# Patient Record
Sex: Male | Born: 1940 | Race: White | Hispanic: No | Marital: Married | Smoking: Never smoker
Health system: Southern US, Community
[De-identification: ages and names within clinical notes are randomized; demographics above are authoritative.]

## PROBLEM LIST (undated history)

## (undated) DIAGNOSIS — G20 Parkinson's disease: Secondary | ICD-10-CM

## (undated) DIAGNOSIS — M1990 Unspecified osteoarthritis, unspecified site: Secondary | ICD-10-CM

## (undated) DIAGNOSIS — K219 Gastro-esophageal reflux disease without esophagitis: Secondary | ICD-10-CM

## (undated) DIAGNOSIS — C801 Malignant (primary) neoplasm, unspecified: Secondary | ICD-10-CM

## (undated) DIAGNOSIS — I499 Cardiac arrhythmia, unspecified: Secondary | ICD-10-CM

## (undated) DIAGNOSIS — R350 Frequency of micturition: Secondary | ICD-10-CM

## (undated) DIAGNOSIS — G3184 Mild cognitive impairment, so stated: Secondary | ICD-10-CM

## (undated) DIAGNOSIS — G20A1 Parkinson's disease without dyskinesia, without mention of fluctuations: Secondary | ICD-10-CM

## (undated) DIAGNOSIS — R2681 Unsteadiness on feet: Secondary | ICD-10-CM

## (undated) HISTORY — PX: HERNIA REPAIR: SHX51

## (undated) HISTORY — PX: TONSILLECTOMY: SUR1361

---

## 2007-04-08 ENCOUNTER — Ambulatory Visit (HOSPITAL_COMMUNITY): Admission: RE | Admit: 2007-04-08 | Discharge: 2007-04-08 | Payer: Self-pay | Admitting: Neurology

## 2007-06-04 ENCOUNTER — Encounter: Payer: Self-pay | Admitting: Internal Medicine

## 2007-06-05 ENCOUNTER — Encounter: Payer: Self-pay | Admitting: Internal Medicine

## 2007-07-06 ENCOUNTER — Encounter: Payer: Self-pay | Admitting: Internal Medicine

## 2009-10-11 ENCOUNTER — Inpatient Hospital Stay: Payer: Self-pay | Admitting: Internal Medicine

## 2009-11-27 ENCOUNTER — Inpatient Hospital Stay: Payer: Self-pay | Admitting: Internal Medicine

## 2009-12-05 ENCOUNTER — Encounter: Payer: Self-pay | Admitting: Internal Medicine

## 2010-01-02 ENCOUNTER — Encounter: Payer: Self-pay | Admitting: Internal Medicine

## 2010-02-02 ENCOUNTER — Encounter: Payer: Self-pay | Admitting: Internal Medicine

## 2010-03-04 ENCOUNTER — Encounter: Payer: Self-pay | Admitting: Internal Medicine

## 2010-08-02 ENCOUNTER — Encounter: Payer: Self-pay | Admitting: Internal Medicine

## 2010-08-04 ENCOUNTER — Encounter: Payer: Self-pay | Admitting: Internal Medicine

## 2010-11-22 ENCOUNTER — Ambulatory Visit: Payer: Self-pay | Admitting: Gastroenterology

## 2012-01-27 ENCOUNTER — Emergency Department: Payer: Self-pay | Admitting: Emergency Medicine

## 2012-01-27 LAB — COMPREHENSIVE METABOLIC PANEL
Anion Gap: 7 (ref 7–16)
BUN: 22 mg/dL — ABNORMAL HIGH (ref 7–18)
Bilirubin,Total: 0.5 mg/dL (ref 0.2–1.0)
Co2: 31 mmol/L (ref 21–32)
EGFR (African American): >60
Osmolality: 286 (ref 275–301)
Potassium: 3.9 mmol/L (ref 3.5–5.1)
SGOT(AST): 23 U/L (ref 15–37)
SGPT (ALT): 24 U/L
Sodium: 142 mmol/L (ref 136–145)
Total Protein: 7.7 g/dL (ref 6.4–8.2)

## 2012-01-27 LAB — URINALYSIS, COMPLETE
Bacteria: NONE SEEN
Leukocyte Esterase: NEGATIVE
Nitrite: NEGATIVE
RBC,UR: 5 /HPF (ref 0–5)
Squamous Epithelial: <1
WBC UR: 1 /HPF (ref 0–5)

## 2012-01-27 LAB — CBC
HCT: 42.5 % (ref 40.0–52.0)
HGB: 14.2 g/dL (ref 13.0–18.0)
RBC: 4.93 10*6/uL (ref 4.40–5.90)
RDW: 13.9 % (ref 11.5–14.5)

## 2012-01-27 LAB — CK TOTAL AND CKMB (NOT AT ARMC): CK-MB: 1.9 ng/mL (ref 0.5–3.6)

## 2012-01-29 LAB — URINE CULTURE

## 2012-02-02 LAB — CULTURE, BLOOD (SINGLE)

## 2013-01-22 ENCOUNTER — Ambulatory Visit: Payer: Self-pay | Admitting: Internal Medicine

## 2013-03-10 ENCOUNTER — Encounter: Payer: Self-pay | Admitting: Neurology

## 2013-04-04 ENCOUNTER — Encounter: Payer: Self-pay | Admitting: Neurology

## 2013-05-04 ENCOUNTER — Encounter: Payer: Self-pay | Admitting: Neurology

## 2013-06-04 ENCOUNTER — Encounter: Payer: Self-pay | Admitting: Neurology

## 2013-06-09 ENCOUNTER — Ambulatory Visit: Payer: Self-pay | Admitting: Neurological Surgery

## 2013-06-11 ENCOUNTER — Other Ambulatory Visit: Payer: Self-pay | Admitting: Neurological Surgery

## 2013-06-29 ENCOUNTER — Encounter (HOSPITAL_COMMUNITY): Payer: Self-pay | Admitting: Pharmacy Technician

## 2013-06-30 ENCOUNTER — Encounter (HOSPITAL_COMMUNITY)
Admission: RE | Admit: 2013-06-30 | Discharge: 2013-06-30 | Disposition: A | Discharge status: Home / Self Care | Payer: Medicare Other | Source: Ambulatory Visit | Attending: Neurological Surgery | Admitting: Neurological Surgery

## 2013-06-30 ENCOUNTER — Encounter (HOSPITAL_COMMUNITY): Payer: Self-pay

## 2013-06-30 DIAGNOSIS — Z01812 Encounter for preprocedural laboratory examination: Secondary | ICD-10-CM | POA: Insufficient documentation

## 2013-06-30 DIAGNOSIS — Z01818 Encounter for other preprocedural examination: Secondary | ICD-10-CM | POA: Insufficient documentation

## 2013-06-30 DIAGNOSIS — Z01810 Encounter for preprocedural cardiovascular examination: Secondary | ICD-10-CM | POA: Insufficient documentation

## 2013-06-30 HISTORY — DX: Parkinson's disease: G20

## 2013-06-30 HISTORY — DX: Cardiac arrhythmia, unspecified: I49.9

## 2013-06-30 HISTORY — DX: Unsteadiness on feet: R26.81

## 2013-06-30 HISTORY — DX: Frequency of micturition: R35.0

## 2013-06-30 HISTORY — DX: Parkinson's disease without dyskinesia, without mention of fluctuations: G20.A1

## 2013-06-30 HISTORY — DX: Unspecified osteoarthritis, unspecified site: M19.90

## 2013-06-30 HISTORY — DX: Gastro-esophageal reflux disease without esophagitis: K21.9

## 2013-06-30 HISTORY — DX: Malignant (primary) neoplasm, unspecified: C80.1

## 2013-06-30 HISTORY — DX: Mild cognitive impairment of uncertain or unknown etiology: G31.84

## 2013-06-30 LAB — BASIC METABOLIC PANEL
CO2: 27 mEq/L (ref 19–32)
Calcium: 9.4 mg/dL (ref 8.4–10.5)
Creatinine, Ser: 1.08 mg/dL (ref 0.50–1.35)

## 2013-06-30 LAB — SURGICAL PCR SCREEN
MRSA, PCR: NEGATIVE
Staphylococcus aureus: NEGATIVE

## 2013-06-30 LAB — CBC WITH DIFFERENTIAL/PLATELET
Basophils Absolute: 0 10*3/uL (ref 0.0–0.1)
Basophils Relative: 1 % (ref 0–1)
Eosinophils Absolute: 0.3 10*3/uL (ref 0.0–0.7)
Hemoglobin: 13.6 g/dL (ref 13.0–17.0)
MCH: 27.8 pg (ref 26.0–34.0)
MCHC: 32.7 g/dL (ref 30.0–36.0)
Monocytes Relative: 7 % (ref 3–12)
Neutrophils Relative %: 68 % (ref 43–77)
Platelets: 229 10*3/uL (ref 150–400)
RDW: 13.5 % (ref 11.5–15.5)

## 2013-06-30 LAB — TYPE AND SCREEN
ABO/RH(D): O NEG
Antibody Screen: NEGATIVE

## 2013-06-30 LAB — ABO/RH: ABO/RH(D): O NEG

## 2013-06-30 LAB — PROTIME-INR: INR: 1.06 (ref 0.00–1.49)

## 2013-06-30 NOTE — Pre-Procedure Instructions (Signed)
JAIMERE FEUTZ  06/30/2013   Your procedure is scheduled on:  Friday, September 5th  Report to [facility] at 1045 AM.  Call this number if you have problems the morning of surgery: [phone]   Remember:   Do not eat food or drink liquids after midnight.   Take these medicines the morning of surgery with A SIP OF WATER: prilosec, ultram if needed   Do not wear jewelry.  Do not wear lotions, powders, or perfume,deodorant.  Do not shave 48 hours prior to surgery. Men may shave face and neck.  Do not bring valuables to the hospital.  [facility] is not responsible   for any belongings or valuables.  Contacts, dentures or bridgework may not be worn into surgery.  Leave suitcase in the car. After surgery it may be brought to your room.  For patients admitted to the hospital, checkout time is 11:00 AM the day of discharge.   Patients discharged the day of surgery will not be allowed to drive home.    Special Instructions: Shower using CHG 2 nights before surgery and the night before surgery.  If you shower the day of surgery use CHG.  Use special wash - you have one bottle of CHG for all showers.  You should use approximately 1/3 of the bottle for each shower.   Please read over the following fact sheets that you were given: Pain Booklet, Coughing and Deep Breathing, Blood Transfusion Information, MRSA Information and Surgical Site Infection Prevention

## 2013-06-30 NOTE — Progress Notes (Signed)
Primary physician - Dr. Nona Dell clinic cardiologist cannot remember name - seen approximately 6 months ago no recent cardiac testing

## 2013-07-08 MED ORDER — DEXAMETHASONE SODIUM PHOSPHATE 10 MG/ML IJ SOLN
10.0000 mg | INTRAMUSCULAR | Status: AC
  Administered 2013-07-09: 10 mg via INTRAVENOUS
  Filled 2013-07-08: qty 1

## 2013-07-08 MED ORDER — CEFAZOLIN SODIUM-DEXTROSE 2-3 GM-% IV SOLR: 2.0000 g | INTRAVENOUS | Status: DC

## 2013-07-09 ENCOUNTER — Inpatient Hospital Stay (HOSPITAL_COMMUNITY)
Admission: RE | Admit: 2013-07-09 | Discharge: 2013-07-15 | DRG: 460 | Disposition: A | Discharge status: Skilled Nursing Facility | Payer: Medicare Other | Source: Ambulatory Visit | Attending: Neurological Surgery | Admitting: Neurological Surgery

## 2013-07-09 ENCOUNTER — Encounter (HOSPITAL_COMMUNITY): Payer: Self-pay | Admitting: Anesthesiology

## 2013-07-09 ENCOUNTER — Encounter (HOSPITAL_COMMUNITY)
Admission: RE | Disposition: A | Discharge status: Skilled Nursing Facility | Payer: Self-pay | Source: Ambulatory Visit | Attending: Neurological Surgery

## 2013-07-09 ENCOUNTER — Inpatient Hospital Stay (HOSPITAL_COMMUNITY): Payer: Medicare Other

## 2013-07-09 ENCOUNTER — Encounter (HOSPITAL_COMMUNITY): Payer: Self-pay | Admitting: *Deleted

## 2013-07-09 ENCOUNTER — Inpatient Hospital Stay (HOSPITAL_COMMUNITY): Payer: Medicare Other | Admitting: Anesthesiology

## 2013-07-09 DIAGNOSIS — G3184 Mild cognitive impairment, so stated: Secondary | ICD-10-CM | POA: Diagnosis present

## 2013-07-09 DIAGNOSIS — G20A1 Parkinson's disease without dyskinesia, without mention of fluctuations: Secondary | ICD-10-CM | POA: Diagnosis present

## 2013-07-09 DIAGNOSIS — M48061 Spinal stenosis, lumbar region without neurogenic claudication: Principal | ICD-10-CM | POA: Diagnosis present

## 2013-07-09 DIAGNOSIS — M129 Arthropathy, unspecified: Secondary | ICD-10-CM | POA: Diagnosis present

## 2013-07-09 DIAGNOSIS — G20 Parkinson's disease: Secondary | ICD-10-CM | POA: Diagnosis present

## 2013-07-09 DIAGNOSIS — R413 Other amnesia: Secondary | ICD-10-CM | POA: Diagnosis present

## 2013-07-09 DIAGNOSIS — K219 Gastro-esophageal reflux disease without esophagitis: Secondary | ICD-10-CM | POA: Diagnosis present

## 2013-07-09 DIAGNOSIS — Z85828 Personal history of other malignant neoplasm of skin: Secondary | ICD-10-CM

## 2013-07-09 DIAGNOSIS — I4891 Unspecified atrial fibrillation: Secondary | ICD-10-CM | POA: Diagnosis present

## 2013-07-09 HISTORY — PX: MAXIMUM ACCESS (MAS)POSTERIOR LUMBAR INTERBODY FUSION (PLIF) 2 LEVEL: SHX6369

## 2013-07-09 SURGERY — FOR MAXIMUM ACCESS (MAS) POSTERIOR LUMBAR INTERBODY FUSION (PLIF) 2 LEVEL
Anesthesia: General | Site: Spine Lumbar | Wound class: Clean

## 2013-07-09 MED ORDER — B COMPLEX-C PO TABS
1.0000 | ORAL_TABLET | Freq: Every day | ORAL | Status: DC
  Administered 2013-07-09 – 2013-07-15 (×7): 1 via ORAL
  Filled 2013-07-09 (×7): qty 1

## 2013-07-09 MED ORDER — HYDROMORPHONE HCL PF 1 MG/ML IJ SOLN
INTRAMUSCULAR | Status: AC
  Filled 2013-07-09: qty 1

## 2013-07-09 MED ORDER — SODIUM CHLORIDE 0.9 % IJ SOLN
3.0000 mL | Freq: Two times a day (BID) | INTRAMUSCULAR | Status: DC
  Administered 2013-07-10 – 2013-07-15 (×10): 3 mL via INTRAVENOUS

## 2013-07-09 MED ORDER — FLUDROCORTISONE ACETATE 0.1 MG PO TABS
0.0500 mg | ORAL_TABLET | Freq: Every day | ORAL | Status: DC
  Administered 2013-07-09 – 2013-07-15 (×7): 0.05 mg via ORAL
  Filled 2013-07-09 (×7): qty 0.5

## 2013-07-09 MED ORDER — DEXAMETHASONE SODIUM PHOSPHATE 4 MG/ML IJ SOLN
4.0000 mg | Freq: Four times a day (QID) | INTRAMUSCULAR | Status: DC
  Administered 2013-07-11 – 2013-07-12 (×2): 4 mg via INTRAVENOUS
  Filled 2013-07-09 (×12): qty 1

## 2013-07-09 MED ORDER — TRAMADOL HCL 50 MG PO TABS: 50.0000 mg | ORAL_TABLET | Freq: Four times a day (QID) | ORAL | Status: DC | PRN

## 2013-07-09 MED ORDER — METHOCARBAMOL 500 MG PO TABS
500.0000 mg | ORAL_TABLET | Freq: Four times a day (QID) | ORAL | Status: DC | PRN
  Administered 2013-07-14 – 2013-07-15 (×2): 500 mg via ORAL
  Filled 2013-07-09 (×3): qty 1

## 2013-07-09 MED ORDER — EPHEDRINE SULFATE 50 MG/ML IJ SOLN
INTRAMUSCULAR | Status: DC | PRN
  Administered 2013-07-09: 10 mg via INTRAVENOUS

## 2013-07-09 MED ORDER — DEXAMETHASONE 4 MG PO TABS
4.0000 mg | ORAL_TABLET | Freq: Four times a day (QID) | ORAL | Status: DC
  Administered 2013-07-09 – 2013-07-11 (×7): 4 mg via ORAL
  Filled 2013-07-09 (×14): qty 1

## 2013-07-09 MED ORDER — POTASSIUM CHLORIDE IN NACL 20-0.9 MEQ/L-% IV SOLN
INTRAVENOUS | Status: DC
  Administered 2013-07-09: 1000 mL via INTRAVENOUS
  Filled 2013-07-09 (×12): qty 1000

## 2013-07-09 MED ORDER — LIDOCAINE HCL (CARDIAC) 20 MG/ML IV SOLN
INTRAVENOUS | Status: DC | PRN
  Administered 2013-07-09: 50 mg via INTRAVENOUS

## 2013-07-09 MED ORDER — ROPINIROLE HCL 0.5 MG PO TABS
0.5000 mg | ORAL_TABLET | Freq: Every day | ORAL | Status: DC
  Administered 2013-07-09 – 2013-07-15 (×7): 0.5 mg via ORAL
  Filled 2013-07-09 (×7): qty 1

## 2013-07-09 MED ORDER — BUPIVACAINE HCL (PF) 0.25 % IJ SOLN
INTRAMUSCULAR | Status: DC | PRN
  Administered 2013-07-09: 10 mL
  Administered 2013-07-09: 8 mL

## 2013-07-09 MED ORDER — PANTOPRAZOLE SODIUM 40 MG PO TBEC
40.0000 mg | DELAYED_RELEASE_TABLET | Freq: Every day | ORAL | Status: DC
  Administered 2013-07-09 – 2013-07-15 (×7): 40 mg via ORAL
  Filled 2013-07-09 (×5): qty 1

## 2013-07-09 MED ORDER — THROMBIN 20000 UNITS EX SOLR
CUTANEOUS | Status: DC | PRN
  Administered 2013-07-09: 15:00:00 via TOPICAL

## 2013-07-09 MED ORDER — METHOCARBAMOL 100 MG/ML IJ SOLN
500.0000 mg | Freq: Four times a day (QID) | INTRAVENOUS | Status: DC | PRN
  Filled 2013-07-09: qty 5

## 2013-07-09 MED ORDER — LACTATED RINGERS IV SOLN
INTRAVENOUS | Status: DC | PRN
  Administered 2013-07-09 (×3): via INTRAVENOUS

## 2013-07-09 MED ORDER — ONDANSETRON HCL 4 MG/2ML IJ SOLN: 4.0000 mg | INTRAMUSCULAR | Status: DC | PRN

## 2013-07-09 MED ORDER — DONEPEZIL HCL 10 MG PO TABS
10.0000 mg | ORAL_TABLET | Freq: Every day | ORAL | Status: DC
  Administered 2013-07-09 – 2013-07-15 (×7): 10 mg via ORAL
  Filled 2013-07-09 (×7): qty 1

## 2013-07-09 MED ORDER — PROPOFOL 10 MG/ML IV BOLUS
INTRAVENOUS | Status: DC | PRN
  Administered 2013-07-09: 200 mg via INTRAVENOUS

## 2013-07-09 MED ORDER — FENTANYL CITRATE 0.05 MG/ML IJ SOLN
INTRAMUSCULAR | Status: DC | PRN
  Administered 2013-07-09: 50 ug via INTRAVENOUS
  Administered 2013-07-09: 75 ug via INTRAVENOUS
  Administered 2013-07-09 (×2): 50 ug via INTRAVENOUS
  Administered 2013-07-09: 75 ug via INTRAVENOUS
  Administered 2013-07-09 (×3): 50 ug via INTRAVENOUS

## 2013-07-09 MED ORDER — CARBIDOPA-LEVODOPA 25-100 MG PO TABS
1.0000 | ORAL_TABLET | Freq: Every day | ORAL | Status: DC
  Administered 2013-07-10 – 2013-07-15 (×6): 1 via ORAL
  Filled 2013-07-09 (×6): qty 1

## 2013-07-09 MED ORDER — PHENOL 1.4 % MT LIQD: 1.0000 | OROMUCOSAL | Status: DC | PRN

## 2013-07-09 MED ORDER — CARBIDOPA-LEVODOPA 25-100 MG PO TABS
1.5000 | ORAL_TABLET | Freq: Two times a day (BID) | ORAL | Status: DC
  Administered 2013-07-09 – 2013-07-15 (×12): 1.5 via ORAL
  Filled 2013-07-09 (×13): qty 1.5

## 2013-07-09 MED ORDER — LACTATED RINGERS IV SOLN
INTRAVENOUS | Status: DC
  Administered 2013-07-09: 11:00:00 via INTRAVENOUS

## 2013-07-09 MED ORDER — MENTHOL 3 MG MT LOZG: 1.0000 | LOZENGE | OROMUCOSAL | Status: DC | PRN

## 2013-07-09 MED ORDER — ASPIRIN EC 81 MG PO TBEC
81.0000 mg | DELAYED_RELEASE_TABLET | Freq: Every morning | ORAL | Status: DC
  Administered 2013-07-10 – 2013-07-15 (×6): 81 mg via ORAL
  Filled 2013-07-09 (×6): qty 1

## 2013-07-09 MED ORDER — ACETAMINOPHEN 650 MG RE SUPP: 650.0000 mg | RECTAL | Status: DC | PRN

## 2013-07-09 MED ORDER — B COMPLEX PO TABS: 1.0000 | ORAL_TABLET | Freq: Every day | ORAL | Status: DC

## 2013-07-09 MED ORDER — SODIUM CHLORIDE 0.9 % IJ SOLN: 3.0000 mL | INTRAMUSCULAR | Status: DC | PRN

## 2013-07-09 MED ORDER — VANCOMYCIN HCL IN DEXTROSE 1-5 GM/200ML-% IV SOLN
1000.0000 mg | Freq: Once | INTRAVENOUS | Status: AC
  Administered 2013-07-09: 1000 mg via INTRAVENOUS

## 2013-07-09 MED ORDER — VANCOMYCIN HCL IN DEXTROSE 1-5 GM/200ML-% IV SOLN
1000.0000 mg | Freq: Once | INTRAVENOUS | Status: AC
  Administered 2013-07-09: 1000 mg via INTRAVENOUS
  Filled 2013-07-09: qty 200

## 2013-07-09 MED ORDER — SODIUM CHLORIDE 0.9 % IV SOLN: 250.0000 mL | INTRAVENOUS | Status: DC

## 2013-07-09 MED ORDER — OXYCODONE HCL 5 MG/5ML PO SOLN: 5.0000 mg | Freq: Once | ORAL | Status: DC | PRN

## 2013-07-09 MED ORDER — SODIUM CHLORIDE 0.9 % IR SOLN
Status: DC | PRN
  Administered 2013-07-09: 15:00:00

## 2013-07-09 MED ORDER — HYDROMORPHONE HCL PF 1 MG/ML IJ SOLN
0.2500 mg | INTRAMUSCULAR | Status: DC | PRN
  Administered 2013-07-09 (×2): 0.25 mg via INTRAVENOUS

## 2013-07-09 MED ORDER — OXYCODONE HCL 5 MG PO TABS: 5.0000 mg | ORAL_TABLET | Freq: Once | ORAL | Status: DC | PRN

## 2013-07-09 MED ORDER — ROCURONIUM BROMIDE 100 MG/10ML IV SOLN
INTRAVENOUS | Status: DC | PRN
  Administered 2013-07-09: 50 mg via INTRAVENOUS

## 2013-07-09 MED ORDER — ONDANSETRON HCL 4 MG/2ML IJ SOLN
INTRAMUSCULAR | Status: DC | PRN
  Administered 2013-07-09: 4 mg via INTRAVENOUS

## 2013-07-09 MED ORDER — METOCLOPRAMIDE HCL 5 MG/ML IJ SOLN: 10.0000 mg | Freq: Once | INTRAMUSCULAR | Status: DC | PRN

## 2013-07-09 MED ORDER — MORPHINE SULFATE 2 MG/ML IJ SOLN
1.0000 mg | INTRAMUSCULAR | Status: DC | PRN
  Administered 2013-07-09: 2 mg via INTRAVENOUS
  Filled 2013-07-09: qty 1

## 2013-07-09 MED ORDER — 0.9 % SODIUM CHLORIDE (POUR BTL) OPTIME
TOPICAL | Status: DC | PRN
  Administered 2013-07-09: 1000 mL

## 2013-07-09 MED ORDER — OXYCODONE-ACETAMINOPHEN 5-325 MG PO TABS
1.0000 | ORAL_TABLET | ORAL | Status: DC | PRN
  Administered 2013-07-10 – 2013-07-11 (×3): 2 via ORAL
  Administered 2013-07-11 – 2013-07-13 (×5): 1 via ORAL
  Administered 2013-07-13: 2 via ORAL
  Administered 2013-07-13 – 2013-07-15 (×9): 1 via ORAL
  Filled 2013-07-09 (×8): qty 1
  Filled 2013-07-09: qty 2
  Filled 2013-07-09 (×2): qty 1
  Filled 2013-07-09: qty 2
  Filled 2013-07-09 (×4): qty 1
  Filled 2013-07-09 (×2): qty 2

## 2013-07-09 MED ORDER — CEFAZOLIN SODIUM 1-5 GM-% IV SOLN: 1.0000 g | Freq: Three times a day (TID) | INTRAVENOUS | Status: DC

## 2013-07-09 MED ORDER — ACETAMINOPHEN 325 MG PO TABS: 650.0000 mg | ORAL_TABLET | ORAL | Status: DC | PRN

## 2013-07-09 SURGICAL SUPPLY — 74 items
APL SKNCLS STERI-STRIP NONHPOA (GAUZE/BANDAGES/DRESSINGS) ×1
BAG DECANTER FOR FLEXI CONT (MISCELLANEOUS) ×2 IMPLANT
BENZOIN TINCTURE PRP APPL 2/3 (GAUZE/BANDAGES/DRESSINGS) ×2 IMPLANT
BLADE SURG ROTATE 9660 (MISCELLANEOUS) IMPLANT
BONE MATRIX OSTEOCEL PRO MED (Bone Implant) ×2 IMPLANT
BUR MATCHSTICK NEURO 3.0 LAGG (BURR) ×2 IMPLANT
CAGE COROENT 10X9X23 (Cage) ×1 IMPLANT
CAGE COROENT 10X9X28-8 LUMBAR (Cage) ×2 IMPLANT
CAGE COROENT 12X28X4 (Cage) ×1 IMPLANT
CANISTER SUCTION 2500CC (MISCELLANEOUS) ×2 IMPLANT
CLIP NEUROVISION LG (CLIP) ×1 IMPLANT
CLOTH BEACON ORANGE TIMEOUT ST (SAFETY) ×2 IMPLANT
CONT SPEC 4OZ CLIKSEAL STRL BL (MISCELLANEOUS) ×4 IMPLANT
COVER BACK TABLE 24X17X13 BIG (DRAPES) IMPLANT
COVER TABLE BACK 60X90 (DRAPES) ×2 IMPLANT
DRAPE C-ARM 42X72 X-RAY (DRAPES) ×2 IMPLANT
DRAPE C-ARMOR (DRAPES) ×2 IMPLANT
DRAPE LAPAROTOMY 100X72X124 (DRAPES) ×2 IMPLANT
DRAPE POUCH INSTRU U-SHP 10X18 (DRAPES) ×2 IMPLANT
DRAPE SURG 17X23 STRL (DRAPES) ×2 IMPLANT
DRESSING TELFA 8X3 (GAUZE/BANDAGES/DRESSINGS) ×1 IMPLANT
DRSG OPSITE 4X5.5 SM (GAUZE/BANDAGES/DRESSINGS) ×2 IMPLANT
DRSG OPSITE POSTOP 4X6 (GAUZE/BANDAGES/DRESSINGS) ×1 IMPLANT
DURAPREP 26ML APPLICATOR (WOUND CARE) ×2 IMPLANT
ELECT REM PT RETURN 9FT ADLT (ELECTROSURGICAL) ×2
ELECTRODE REM PT RTRN 9FT ADLT (ELECTROSURGICAL) ×1 IMPLANT
EVACUATOR 1/8 PVC DRAIN (DRAIN) ×2 IMPLANT
GAUZE SPONGE 4X4 16PLY XRAY LF (GAUZE/BANDAGES/DRESSINGS) IMPLANT
GLOVE BIO SURGEON STRL SZ 6.5 (GLOVE) ×2 IMPLANT
GLOVE BIO SURGEON STRL SZ8 (GLOVE) ×6 IMPLANT
GLOVE BIO SURGEON STRL SZ8.5 (GLOVE) ×1 IMPLANT
GLOVE BIOGEL PI IND STRL 6.5 (GLOVE) IMPLANT
GLOVE BIOGEL PI IND STRL 7.0 (GLOVE) IMPLANT
GLOVE BIOGEL PI INDICATOR 6.5 (GLOVE) ×1
GLOVE BIOGEL PI INDICATOR 7.0 (GLOVE) ×3
GLOVE OPTIFIT SS 6.5 STRL BRWN (GLOVE) ×3 IMPLANT
GLOVE SS BIOGEL STRL SZ 8 (GLOVE) IMPLANT
GLOVE SUPERSENSE BIOGEL SZ 8 (GLOVE) ×1
GOWN BRE IMP SLV AUR LG STRL (GOWN DISPOSABLE) ×2 IMPLANT
GOWN BRE IMP SLV AUR XL STRL (GOWN DISPOSABLE) ×4 IMPLANT
GOWN STRL REIN 2XL LVL4 (GOWN DISPOSABLE) IMPLANT
HEMOSTAT POWDER KIT SURGIFOAM (HEMOSTASIS) IMPLANT
KIT BASIN OR (CUSTOM PROCEDURE TRAY) ×2 IMPLANT
KIT NDL NVM5 EMG ELECT (KITS) IMPLANT
KIT NEEDLE NVM5 EMG ELECT (KITS) ×1 IMPLANT
KIT NEEDLE NVM5 EMG ELECTRODE (KITS) ×1
KIT ROOM TURNOVER OR (KITS) ×2 IMPLANT
MILL MEDIUM DISP (BLADE) ×1 IMPLANT
NDL HYPO 25X1 1.5 SAFETY (NEEDLE) ×1 IMPLANT
NEEDLE HYPO 25X1 1.5 SAFETY (NEEDLE) ×2 IMPLANT
NS IRRIG 1000ML POUR BTL (IV SOLUTION) ×2 IMPLANT
PACK LAMINECTOMY NEURO (CUSTOM PROCEDURE TRAY) ×2 IMPLANT
PAD ARMBOARD 7.5X6 YLW CONV (MISCELLANEOUS) ×8 IMPLANT
ROD 70MM (Rod) ×4 IMPLANT
ROD SPNL 70XPREBNT NS MAS (Rod) IMPLANT
SCREW LOCK (Screw) ×12 IMPLANT
SCREW LOCK FXNS SPNE MAS PL (Screw) IMPLANT
SCREW PLIF MAS 5.0X35 (Screw) ×1 IMPLANT
SCREW SHANK 5.0X30MM (Screw) ×2 IMPLANT
SCREW SHANK 5.0X35 (Screw) ×2 IMPLANT
SCREW TULIP 5.5 (Screw) ×4 IMPLANT
SPONGE LAP 4X18 X RAY DECT (DISPOSABLE) IMPLANT
SPONGE SURGIFOAM ABS GEL 100 (HEMOSTASIS) ×2 IMPLANT
STRIP CLOSURE SKIN 1/2X4 (GAUZE/BANDAGES/DRESSINGS) ×3 IMPLANT
SUT VIC AB 0 CT1 18XCR BRD8 (SUTURE) ×1 IMPLANT
SUT VIC AB 0 CT1 8-18 (SUTURE) ×2
SUT VIC AB 2-0 CP2 18 (SUTURE) ×2 IMPLANT
SUT VIC AB 3-0 SH 8-18 (SUTURE) ×4 IMPLANT
SYR 20ML ECCENTRIC (SYRINGE) ×2 IMPLANT
TOWEL OR 17X24 6PK STRL BLUE (TOWEL DISPOSABLE) ×2 IMPLANT
TOWEL OR 17X26 10 PK STRL BLUE (TOWEL DISPOSABLE) ×2 IMPLANT
TRAY FOLEY CATH 14FR (SET/KITS/TRAYS/PACK) IMPLANT
TRAY FOLEY CATH 14FRSI W/METER (CATHETERS) ×2 IMPLANT
WATER STERILE IRR 1000ML POUR (IV SOLUTION) ×2 IMPLANT

## 2013-07-09 NOTE — Anesthesia Preprocedure Evaluation (Addendum)
Anesthesia Evaluation  Patient identified by MRN, date of birth, ID band Patient awake    Reviewed: Allergy & Precautions, H&P , NPO status , Patient's Chart, lab work & pertinent test results, reviewed documented beta blocker date and time   Airway Mallampati: II TM Distance: >3 FB Neck ROM: full    Dental  (+) Teeth Intact and Dental Advisory Given   Pulmonary neg pulmonary ROS,  breath sounds clear to auscultation        Cardiovascular negative cardio ROS  + dysrhythmias Rhythm:regular     Neuro/Psych Cognitive impairment  negative psych ROS   GI/Hepatic negative GI ROS, Neg liver ROS, GERD-  Medicated and Controlled,  Endo/Other  negative endocrine ROS  Renal/GU negative Renal ROS  negative genitourinary   Musculoskeletal   Abdominal   Peds  Hematology negative hematology ROS (+)   Anesthesia Other Findings See surgeon's H&P   Reproductive/Obstetrics negative OB ROS                         Anesthesia Physical Anesthesia Plan  ASA: III  Anesthesia Plan: General   Post-op Pain Management:    Induction: Intravenous  Airway Management Planned: Oral ETT  Additional Equipment:   Intra-op Plan:   Post-operative Plan: Extubation in OR  Informed Consent: I have reviewed the patients History and Physical, chart, labs and discussed the procedure including the risks, benefits and alternatives for the proposed anesthesia with the patient or authorized representative who has indicated his/her understanding and acceptance.   Dental Advisory Given  Plan Discussed with: CRNA and Surgeon  Anesthesia Plan Comments:        Anesthesia Quick Evaluation

## 2013-07-09 NOTE — Preoperative (Addendum)
Beta Blockers   Reason not to administer Beta Blockers:Not on Beta Blocker 

## 2013-07-09 NOTE — Anesthesia Postprocedure Evaluation (Signed)
  Anesthesia Post-op Note  Patient: Ian Estrada  Procedure(s) Performed: Procedure(s) with comments: FOR MAXIMUM ACCESS (MAS) POSTERIOR LUMBAR INTERBODY FUSION (PLIF) 2 LEVEL LUMBAR THREE-FOUR,LUMBAR FOUR-FIVE (N/A) - FOR MAXIMUM ACCESS (MAS) POSTERIOR LUMBAR INTERBODY FUSION (PLIF) 2 LEVEL  Patient Location: PACU  Anesthesia Type:General  Level of Consciousness: awake and patient cooperative  Airway and Oxygen Therapy: Patient Spontanous Breathing  Post-op Pain: mild  Post-op Assessment: Post-op Vital signs reviewed  Post-op Vital Signs: stable  Complications: No apparent anesthesia complications

## 2013-07-09 NOTE — Op Note (Signed)
07/09/2013  6:46 PM  PATIENT:  Ian Estrada  72 y.o. male  PRE-OPERATIVE DIAGNOSIS:  Spondylolisthesis L4-5, severe spinal stenosis L3-4 L4-5, back pain and leg pain  POST-OPERATIVE DIAGNOSIS:  Same  PROCEDURE:   1. Decompressive lumbar laminectomy L3-4, L4-5 requiring more work than would be required for a simple exposure of the disk for PLIF in order to adequately decompress the neural elements and address the spinal stenosis 2. Posterior lumbar interbody fusion L3-4, L4-5 using PEEK interbody cages packed with morcellized allograft and autograft 3. Posterior fixation L3-5 inclusive using Nuvasive cortical pedicle screws.  4. Intertransverse arthrodesis L3-5 using morcellized autograft and allograft.  SURGEON:  Marikay Alar, MD  ASSISTANTS: Lovell Sheehan  ANESTHESIA:  General  EBL: 400 ml  Total I/O In: 2700 [I.V.:2700] Out: 1170 [Urine:770; Blood:400]  BLOOD ADMINISTERED:none  DRAINS: Hemovac   INDICATION FOR PROCEDURE: This patient presented with a long history of back and leg pain. MRI and plain films showed grade 1 spondylolisthesis L4-5. He had severe spinal stenosis at L3-4 and L4-5. He tried medical management question thousand relief. Recommended a decompression and instrumented fusion at L3-4 L4-5. Patient understood the risks, benefits, and alternatives and potential outcomes and wished to proceed.  PROCEDURE DETAILS:  The patient was brought to the operating room. After induction of generalized endotracheal anesthesia the patient was rolled into the prone position on chest rolls and all pressure points were padded. The patient's lumbar region was cleaned and then prepped with DuraPrep and draped in the usual sterile fashion. Anesthesia was injected and then a dorsal midline incision was made and carried down to the lumbosacral fascia. The fascia was opened and the paraspinous musculature was taken down in a subperiosteal fashion to expose L3-4 and L4-5. A self-retaining  retractor was placed. Intraoperative fluoroscopy confirmed my level, and I started with placement of the L3 and L4 cortical pedicle screws. The pedicle screw entry zones were identified utilizing surface landmarks and  AP and lateral fluoroscopy. I scored the cortex with the high-speed drill and then used the hand drill and EMG monitoring to drill an upward and outward direction into the pedicle. I then tapped line to line, and the tap was also monitored. I then placed a 5-0 x 30 mm cortical pedicle screw into the pedicles of L3 and L4 bilaterally. I then turned my attention to the decompression and the spinous process was removed and complete lumbar laminectomies, hemi- facetectomies, and foraminotomies were performed at L3-4 L4-5. The patient had significant spinal stenosis and this required more work than would be required for a simple exposure of the disc for posterior lumbar interbody fusion. Much more generous decompression was undertaken in order to adequately decompress the neural elements and address the patient's leg pain. The yellow ligament was removed to expose the underlying dura and nerve roots, and generous foraminotomies were performed to adequately decompress the neural elements. Both the exiting and traversing nerve roots were decompressed on both sides until a coronary dilator passed easily along the nerve roots. Once the decompression was complete, I turned my attention to the posterior lower lumbar interbody fusion. The epidural venous vasculature was coagulated and cut sharply. Disc space was incised and the initial discectomy was performed with pituitary rongeurs. The disc space was distracted with sequential distractors to a height of 12 mm at L3-4 and 10 mm at L4-5. We then used a series of scrapers and shavers to prepare the endplates for fusion. The midline was prepared with Epstein curettes. Once  the complete discectomy was finished, we packed an appropriate sized peek interbody cage  with local autograft and morcellized allograft, gently retracted the nerve root, and tapped the cage into position at L3-4 and L4-5 bilaterally.  The midline between the cages was packed with morselized autograft and allograft. We then turned our attention to the placement of the lower pedicle screws. The pedicle screw entry zones were identified utilizing surface landmarks and fluoroscopy. I drilled into each pedicle utilizing the hand drill and EMG monitoring, and tapped each pedicle with the appropriate tap. We palpated with a ball probe to assure no break in the cortex. We then placed 5-0 x 35 mm cortical pedicle screw into the pedicles bilaterally at L5. We then decorticated the transverse processes and laid a mixture of morcellized autograft and allograft out over these to perform intertransverse arthrodesis at L3-L5. We then placed lordotic rods into the multiaxial screw heads of the pedicle screws and locked these in position with the locking caps and anti-torque device. We then checked our construct with AP and lateral fluoroscopy. Irrigated with copious amounts of bacitracin-containing saline solution. Placed a medium Hemovac drain through separate stab incision. Inspected the nerve roots once again to assure adequate decompression, lined to the dura with Gelfoam, and closed the muscle and the fascia with 0 Vicryl. Closed the subcutaneous tissues with 2-0 Vicryl and subcuticular tissues with 3-0 Vicryl. The skin was closed with benzoin and Steri-Strips. Dressing was then applied, the patient was awakened from general anesthesia and transported to the recovery room in stable condition. At the end of the procedure all sponge, needle and instrument counts were correct.   PLAN OF CARE: Admit to inpatient   PATIENT DISPOSITION:  PACU - hemodynamically stable.   Delay start of Pharmacological VTE agent (>24hrs) due to surgical blood loss or risk of bleeding:  yes

## 2013-07-09 NOTE — H&P (Signed)
Subjective: Patient is a 72 y.o. male admitted for PLIF L3-4, L4-5. Onset of symptoms was several months ago, gradually worsening since that time.  The pain is rated severe, and is located at the across the lower back and radiates to legs. The pain is described as aching and occurs intermittently. The symptoms have been progressive. Symptoms are exacerbated by exercise and standing. MRI or CT showed stenosis/ spondylolisthesis L3-4 L4-5.   Past Medical History  Diagnosis Date  . Dysrhythmia     afib  . Urinary frequency   . GERD (gastroesophageal reflux disease)   . Unsteady gait   . Parkinson disease   . Cancer     skin - removal  . Arthritis   . Mild cognitive impairment with memory loss     short term memory d/t parkinson's disease.    Past Surgical History  Procedure Laterality Date  . Hernia repair    . Tonsillectomy      Prior to Admission medications   Medication Sig Start Date End Date Taking? Authorizing Provider  aspirin EC 81 MG tablet Take 81 mg by mouth every morning.   Yes Historical Provider, MD  B Complex Vitamins (B COMPLEX PO) Take 1 tablet by mouth daily.    Yes Historical Provider, MD  carbidopa-levodopa (SINEMET IR) 25-100 MG per tablet Take 1-1.5 tablets by mouth 3 (three) times daily. Takes 1.5 tab twice daily & 1 tab with dinner   Yes Historical Provider, MD  Cholecalciferol (VITAMIN D PO) Take 1 tablet by mouth daily.    Yes Historical Provider, MD  donepezil (ARICEPT) 10 MG tablet Take 10 mg by mouth daily with supper.    Yes Historical Provider, MD  FLUDROCORTISONE ACETATE PO Take 0.05 mg by mouth daily. Fludrocortisone Acetate 0.1mg - take 0.05mg  (1/2) tab daily)   Yes Historical Provider, MD  omeprazole (PRILOSEC) 20 MG capsule Take 20 mg by mouth daily.   Yes Historical Provider, MD  rOPINIRole (REQUIP) 0.5 MG tablet Take 0.5 mg by mouth daily with supper.    Yes Historical Provider, MD  traMADol (ULTRAM) 50 MG tablet Take 50 mg by mouth 2 (two) times  daily as needed for pain. For pain   Yes Historical Provider, MD   Allergies  Allergen Reactions  . Penicillins Other (See Comments)    Unknown reaction per wife-Childhood reaction    History  Substance Use Topics  . Smoking status: Never Smoker   . Smokeless tobacco: Not on file  . Alcohol Use: No    History reviewed. No pertinent family history.   Review of Systems  Positive ROS: neg  All other systems have been reviewed and were otherwise negative with the exception of those mentioned in the HPI and as above.  Objective: Vital signs in last 24 hours: Temp:  [97.8 F (36.6 C)] 97.8 F (36.6 C) (09/05 1101) Pulse Rate:  [89] 89 (09/05 1101) Resp:  [18] 18 (09/05 1101) BP: (143)/(100) 143/100 mmHg (09/05 1101) SpO2:  [100 %] 100 % (09/05 1101)  General Appearance: Alert, cooperative, no distress, appears stated age Head: Normocephalic, without obvious abnormality, atraumatic Eyes: PERRL, conjunctiva/corneas clear, EOM's intact    Neck: Supple, symmetrical, trachea midline Back: Symmetric, no curvature, ROM normal, no CVA tenderness Lungs:  respirations unlabored Heart: Regular rate and rhythm Abdomen: Soft, non-tender Extremities: Extremities normal, atraumatic, no cyanosis or edema Pulses: 2+ and symmetric all extremities Skin: Skin color, texture, turgor normal, no rashes or lesions  NEUROLOGIC:   Mental status: Alert  and oriented x4,  no aphasia, masked face Motor Exam - grossly normal strength with bradykinesia Sensory Exam - grossly normal Reflexes: 1+ Coordination - poor Gait - shuffling Balance - poor Cranial Nerves: I: smell Not tested  II: visual acuity  OS: nl    OD: nl  II: visual fields Full to confrontation  II: pupils Equal, round, reactive to light  III,VII: ptosis None  III,IV,VI: extraocular muscles  Full ROM  V: mastication Normal  V: facial light touch sensation  Normal  V,VII: corneal reflex  Present  VII: facial muscle function -  upper  Normal  VII: facial muscle function - lower Normal  VIII: hearing Not tested  IX: soft palate elevation  Normal  IX,X: gag reflex Present  XI: trapezius strength  5/5  XI: sternocleidomastoid strength 5/5  XI: neck flexion strength  5/5  XII: tongue strength  Normal    Data Review Lab Results  Component Value Date   WBC 7.8 06/30/2013   HGB 13.6 06/30/2013   HCT 41.6 06/30/2013   MCV 84.9 06/30/2013   PLT 229 06/30/2013   Lab Results  Component Value Date   NA 138 06/30/2013   K 4.0 06/30/2013   CL 102 06/30/2013   CO2 27 06/30/2013   BUN 21 06/30/2013   CREATININE 1.08 06/30/2013   GLUCOSE 82 06/30/2013   Lab Results  Component Value Date   INR 1.06 06/30/2013    Assessment/Plan: Patient admitted for PLIF L3-4, L4-5. Patient has failed a reasonable attempt at conservative therapy.  I explained the condition and procedure to the patient and answered any questions.  Patient wishes to proceed with procedure as planned. Understands risks/ benefits and typical outcomes of procedure.   Ian Estrada S 07/09/2013 1:39 PM

## 2013-07-09 NOTE — Transfer of Care (Signed)
Immediate Anesthesia Transfer of Care Note  Patient: Ian Estrada  Procedure(s) Performed: Procedure(s) with comments: FOR MAXIMUM ACCESS (MAS) POSTERIOR LUMBAR INTERBODY FUSION (PLIF) 2 LEVEL LUMBAR THREE-FOUR,LUMBAR FOUR-FIVE (N/A) - FOR MAXIMUM ACCESS (MAS) POSTERIOR LUMBAR INTERBODY FUSION (PLIF) 2 LEVEL  Patient Location: PACU  Anesthesia Type:General  Level of Consciousness: sedated  Airway & Oxygen Therapy: Patient Spontanous Breathing and Patient connected to nasal cannula oxygen  Post-op Assessment: Report given to PACU RN and Post -op Vital signs reviewed and stable  Post vital signs: Reviewed and stable  Complications: No apparent anesthesia complications

## 2013-07-09 NOTE — Anesthesia Procedure Notes (Signed)
Procedure Name: Intubation Date/Time: 07/09/2013 2:02 PM Performed by: Alanda Amass A Pre-anesthesia Checklist: Patient identified, Timeout performed, Emergency Drugs available, Suction available and Patient being monitored Patient Re-evaluated:Patient Re-evaluated prior to inductionOxygen Delivery Method: Circle system utilized Preoxygenation: Pre-oxygenation with 100% oxygen Intubation Type: IV induction Ventilation: Mask ventilation without difficulty Laryngoscope Size: Mac and 3 Grade View: Grade I Tube type: Oral Tube size: 8.0 mm Number of attempts: 1 Airway Equipment and Method: Stylet Placement Confirmation: ETT inserted through vocal cords under direct vision,  breath sounds checked- equal and bilateral and positive ETCO2 Secured at: 22 cm Tube secured with: Tape Dental Injury: Teeth and Oropharynx as per pre-operative assessment

## 2013-07-10 NOTE — Evaluation (Addendum)
Physical Therapy Evaluation Patient Details Name: Ian Estrada MRN: [id number] DOB: 17-Oct-1941 Today's Date: 07/10/2013 Time: 4098-1191 PT Time Calculation (min): 37 min  PT Assessment / Plan / Recommendation History of Present Illness  Pt s/p PLIF L3-4, 4--5. H/x of parkinson's and memory loss.   Clinical Impression  Presents s/p surgery above with h/o Parkinsons demonstrating below impairments impacting functional mobility and safety. Will benefit physical therapy in the acute setting to maximize strength and mobility for safe d/c. Rec post acute rehab prior to d/c home. Needs to be getting out of bed several times daily, at least to a chair with nursing to prevent further worsening of Parkinsons symptoms in addition to the benefits of mobility for spinal surgery!  This has been discussed with nursing staff.     PT Assessment  Patient needs continued PT services    Follow Up Recommendations  CIR    Does the patient have the potential to tolerate intense rehabilitation      Barriers to Discharge Decreased caregiver support wife unable to provide physical assist    Equipment Recommendations  None recommended by PT    Recommendations for Other Services     Frequency Min 5X/week    Precautions / Restrictions Precautions Precautions: Back;Fall Required Braces or Orthoses: Spinal Brace Spinal Brace: Applied in sitting position   Pertinent Vitals/Pain Reports moderate back pain with mobility, pt c/o of dizziness and sick feeling with cold sweat, BP checked and MAP was 51--see OT note for details      Mobility  Bed Mobility Bed Mobility: Rolling Left;Left Sidelying to Sit;Sitting - Scoot to [place] of Bed;Sit to Sidelying Left Rolling Left: 1: +2 Total assist;With rail Rolling Left: Patient Percentage: 30% Left Sidelying to Sit: 1: +2 Total assist;HOB flat Left Sidelying to Sit: Patient Percentage: 40% Sitting - Scoot to Edge of Bed: 1: +2 Total assist Sitting - Scoot to Edge  of Bed: Patient Percentage: 40% Sit to Sidelying Left: 1: +2 Total assist;HOB flat Sit to Sidelying Left: Patient Percentage: 10% Details for Bed Mobility Assistance: Multi modal cues and assist for log roll technique.  Assist to transition LEs and trunk in/out of bed.  Transfers Sit to Stand: 3: Mod assist;From bed;With upper extremity assist Stand to Sit: 4: Min assist;To bed;With upper extremity assist;With armrests Details for Transfer Assistance: Performed sit<>stand with bil UEs on rollator for support.  Assist for power up and balance due to posterior lean.    Exercises     PT Diagnosis: Difficulty walking;Generalized weakness;Acute pain;Abnormality of gait  PT Problem List: Decreased strength;Decreased activity tolerance;Decreased mobility;Decreased balance;Impaired tone PT Treatment Interventions: DME instruction;Gait training;Functional mobility training;Therapeutic activities;Therapeutic exercise;Balance training;Neuromuscular re-education;Patient/family education     PT Goals(Current goals can be found in the care plan section) Acute Rehab PT Goals Patient Stated Goal: stronger PT Goal Formulation: With patient Time For Goal Achievement: 07/17/13 Potential to Achieve Goals: Good  Visit Information  Last PT Received On: 07/10/13 Assistance Needed: +2 History of Present Illness: Pt s/p PLIF L3-4, 4--5. H/x of parkinson's and memory loss.        Prior Functioning  Home Living Family/patient expects to be discharged to:: Inpatient rehab Living Arrangements: Spouse/significant other Prior Function Level of Independence: Needs assistance ADL's / Homemaking Assistance Needed: Personal aide assists with ADLs 10 hrs daily.    Cognition  Cognition Arousal/Alertness: Lethargic Behavior During Therapy: Flat affect Overall Cognitive Status: History of cognitive impairments - at baseline    Extremity/Trunk Assessment Upper Extremity Assessment  Upper Extremity Assessment:  Generalized weakness Lower Extremity Assessment Lower Extremity Assessment: RLE deficits/detail;LLE deficits/detail RLE Deficits / Details: grossly 3/5, increased rigidity and difficulty coordinating movement RLE Coordination: decreased gross motor LLE Deficits / Details: grossly 3/5, increased rigidity and difficulty coordinating movement LLE Coordination: decreased gross motor   Balance Balance Balance Assessed: Yes Static Sitting Balance Static Sitting - Balance Support: Bilateral upper extremity supported;Feet supported Static Sitting - Level of Assistance: 2: Max assist;5: Stand by assistance Static Sitting - Comment/# of Minutes: Pt initially requiring max assist for balance due to left posterior lean ~5 minutes. Pt then able to obtain midline and maintain balance with supervision ~15 minutes. Static Standing Balance Static Standing - Balance Support: Bilateral upper extremity supported Static Standing - Level of Assistance: 3: Mod assist Static Standing - Comment/# of Minutes: Bil UEs supported on rollator.  Assist to shift weight forward and maintain balance.  Pt with heavy posterior lean and rocking back on heels.  Pt stood second time with support placed under bil heels in order to assist in shifting weight anteriorly.  End of Session PT - End of Session Equipment Utilized During Treatment: Gait belt Activity Tolerance: Treatment limited secondary to medical complications (Comment) Patient left: in bed;with call bell/phone within reach Nurse Communication: Mobility status;Other (comment) (low BP)  GP     Meriel Kelliher H 07/10/2013, 12:26 PM

## 2013-07-10 NOTE — Progress Notes (Signed)
Subjective: Patient reports Feels well no leg pain to some pressure back pain is manageable and the pills.  Objective: Vital signs in last 24 hours: Temp:  [96.8 F (36 C)-99.1 F (37.3 C)] 99.1 F (37.3 C) (09/06 1610) Pulse Rate:  [57-107] 86 (09/06 0613) Resp:  [9-20] 20 (09/06 0613) BP: (134-154)/(66-100) 138/74 mmHg (09/06 0613) SpO2:  [94 %-100 %] 100 % (09/06 0613) Weight:  [76.6 kg (168 lb 14 oz)] 76.6 kg (168 lb 14 oz) (09/05 2114)  Intake/Output from previous day: 09/05 0701 - 09/06 0700 In: 2700 [I.V.:2700] Out: 1245 [Urine:845; Blood:400] Intake/Output this shift:    Awake alert neurologically is baseline history of Parkinson's  Lab Results: No results found for this basename: WBC, HGB, HCT, PLT,  in the last 72 hours BMET No results found for this basename: NA, K, CL, CO2, GLUCOSE, BUN, CREATININE, CALCIUM,  in the last 72 hours  Studies/Results: Dg Lumbar Spine 2-3 Views  07/09/2013   *RADIOLOGY REPORT*  Clinical Data: L3-L4 and L4-L5 posterior fixation.  DG C-ARM GT 120 MIN,LUMBAR SPINE - 2-3 VIEW  Comparison:  MRI of 01/22/2013  Findings: AP and lateral views .  Trans pedicle screw fixation at L3-L5.  The L3-L4 screws appear to project superior and posterior within the vertebral bodies.  This is most apparent on the lateral view and may be partially projectional.  Maintenance of vertebral body height.  Interbody fusion material centrally positioned.  IMPRESSION: Intraoperative imaging of L3-L5 fixation.  The L3 and L4 fixation screws appear to project superficial/posterior and superior within the vertebral bodies on the lateral view.   Original Report Authenticated By: Jeronimo Greaves, M.D.   Dg C-arm Gt 120 Min  07/09/2013   *RADIOLOGY REPORT*  Clinical Data: L3-L4 and L4-L5 posterior fixation.  DG C-ARM GT 120 MIN,LUMBAR SPINE - 2-3 VIEW  Comparison:  MRI of 01/22/2013  Findings: AP and lateral views .  Trans pedicle screw fixation at L3-L5.  The L3-L4 screws appear to  project superior and posterior within the vertebral bodies.  This is most apparent on the lateral view and may be partially projectional.  Maintenance of vertebral body height.  Interbody fusion material centrally positioned.  IMPRESSION: Intraoperative imaging of L3-L5 fixation.  The L3 and L4 fixation screws appear to project superficial/posterior and superior within the vertebral bodies on the lateral view.   Original Report Authenticated By: Jeronimo Greaves, M.D.    Assessment/Plan: Continue to mobilize physical therapy wound is clean and dry  LOS: 1 day     Marwin Primmer P 07/10/2013, 8:45 AM

## 2013-07-10 NOTE — Evaluation (Signed)
Occupational Therapy Evaluation Patient Details Name: Ian Estrada MRN: [id number] DOB: Sep 26, 1941 Today's Date: 07/10/2013 Time: 9147-8295 OT Time Calculation (min): 40 min  OT Assessment / Plan / Recommendation History of present illness Pt s/p PLIF L3-4, 4--5. H/x of parkinson's and memory loss.    Clinical Impression   Pt admitted with above. Will continue to follow acutely in order to address below problem list. Recommending CIR to further maximize safety and independence with ADLs and functional mobility.      OT Assessment  Patient needs continued OT Services    Follow Up Recommendations  CIR    Barriers to Discharge      Equipment Recommendations   (TBD)    Recommendations for Other Services    Frequency  Min 2X/week    Precautions / Restrictions Precautions Precautions: Back;Fall Required Braces or Orthoses: Spinal Brace Spinal Brace: Applied in sitting position   Pertinent Vitals/Pain See vitals    ADL  Upper Body Bathing: Simulated;Maximal assistance Where Assessed - Upper Body Bathing: Unsupported sitting Lower Body Bathing: Performed;Maximal assistance Where Assessed - Lower Body Bathing: Supported sit to stand Upper Body Dressing: Performed;Maximal assistance Where Assessed - Upper Body Dressing: Unsupported sitting Lower Body Dressing: Simulated;Maximal assistance Where Assessed - Lower Body Dressing: Supported sit to stand Equipment Used: Gait belt Transfers/Ambulation Related to ADLs: Pt performed sit<>stand x2 trials with mod assist in order to perform peri care.  ADL Comments: Pt's condom catheter had come of on OT/PT arrival and pt's gown soiled.  Pt sat EOB and donned new gown with assist.  Stood with PT/OT for back peri care and bathing LE's.  Pt c/o dizziness during second stand.  Returned to sitting EOB and took BP reading 74/45.  Returned pt to supine in bed and took BP reading again, 114/65 in supine.    OT Diagnosis: Generalized  weakness;Acute pain;Cognitive deficits  OT Problem List: Decreased strength;Decreased activity tolerance;Impaired balance (sitting and/or standing);Decreased cognition;Decreased knowledge of use of DME or AE;Decreased knowledge of precautions;Pain OT Treatment Interventions: Self-care/ADL training;Neuromuscular education;DME and/or AE instruction;Therapeutic activities;Patient/family education;Balance training;Cognitive remediation/compensation   OT Goals(Current goals can be found in the care plan section) Acute Rehab OT Goals Patient Stated Goal: stronger OT Goal Formulation: With patient Time For Goal Achievement: 07/24/13  Visit Information  Last OT Received On: 07/10/13 Assistance Needed: +2 PT/OT Co-Evaluation/Treatment: Yes History of Present Illness: Pt s/p PLIF L3-4, 4--5. H/x of parkinson's and memory loss.        Prior Functioning     Home Living Family/patient expects to be discharged to:: Inpatient rehab Living Arrangements: Spouse/significant other Prior Function Level of Independence: Needs assistance ADL's / Homemaking Assistance Needed: Personal aide assists with ADLs 10 hrs daily.         Vision/Perception Vision - History Baseline Vision: Wears glasses only for reading   Cognition  Cognition Arousal/Alertness: Lethargic Behavior During Therapy: Flat affect Overall Cognitive Status: History of cognitive impairments - at baseline    Extremity/Trunk Assessment Upper Extremity Assessment Upper Extremity Assessment: Generalized weakness Lower Extremity Assessment Lower Extremity Assessment: RLE deficits/detail;LLE deficits/detail RLE Deficits / Details: grossly 3/5, increased rigidity and difficulty coordinating movement RLE Coordination: decreased gross motor LLE Deficits / Details: grossly 3/5, increased rigidity and difficulty coordinating movement LLE Coordination: decreased gross motor     Mobility Bed Mobility Bed Mobility: Rolling Left;Left  Sidelying to Sit;Sitting - Scoot to Edge of Bed;Sit to Sidelying Left Rolling Left: 1: +2 Total assist;With rail Rolling Left: Patient Percentage: 30% Left  Sidelying to Sit: 1: +2 Total assist;HOB flat Left Sidelying to Sit: Patient Percentage: 40% Sitting - Scoot to Edge of Bed: 1: +2 Total assist Sitting - Scoot to Edge of Bed: Patient Percentage: 40% Sit to Sidelying Left: 1: +2 Total assist;HOB flat Sit to Sidelying Left: Patient Percentage: 10% Details for Bed Mobility Assistance: Multi modal cues and assist for log roll technique.  Assist to transition LEs and trunk in/out of bed.  Transfers Transfers: Sit to Stand;Stand to Sit Sit to Stand: 3: Mod assist;From bed;With upper extremity assist Stand to Sit: 4: Min assist;To bed;With upper extremity assist;With armrests Details for Transfer Assistance: Performed sit<>stand with bil UEs on rollator for support.  Assist for power up and balance due to posterior lean.     Exercise     Balance Balance Balance Assessed: Yes Static Sitting Balance Static Sitting - Balance Support: Bilateral upper extremity supported;Feet supported Static Sitting - Level of Assistance: 2: Max assist;5: Stand by assistance Static Sitting - Comment/# of Minutes: Pt initially requiring max assist for balance due to left posterior lean ~5 minutes. Pt then able to obtain midline and maintain balance with supervision ~15 minutes. Static Standing Balance Static Standing - Balance Support: Bilateral upper extremity supported Static Standing - Level of Assistance: 3: Mod assist Static Standing - Comment/# of Minutes: Bil UEs supported on rollator.  Assist to shift weight forward and maintain balance.  Pt with heavy posterior lean and rocking back on heels.  Pt stood second time with support placed under bil heels in order to assist in shifting weight anteriorly.   End of Session OT - End of Session Equipment Utilized During Treatment: Gait belt (rollator) Activity  Tolerance: Other (comment) (c/o dizziness; low BP) Patient left: in bed;with call bell/phone within reach;with family/visitor present Nurse Communication: Mobility status  GO    07/10/2013 Cipriano Mile OTR/L Pager [phone] Office [phone]  Cipriano Mile 07/10/2013, 12:34 PM

## 2013-07-11 NOTE — Progress Notes (Signed)
Patient ID: Ian Estrada, male   DOB: [birth year], 72 y.o.   MRN: [id number] Subjective:  The patient is alert and pleasant. He is considering going into rehabilitation versus skilled nursing facility.  Objective: Vital signs in last 24 hours: Temp:  [98.3 F (36.8 C)-98.6 F (37 C)] 98.3 F (36.8 C) (09/07 0600) Pulse Rate:  [69-88] 74 (09/07 0600) Resp:  [16-20] 16 (09/07 0600) BP: (108-140)/(60-79) 135/79 mmHg (09/07 0600) SpO2:  [96 %-100 %] 98 % (09/07 0600)  Intake/Output from previous day: 09/06 0701 - 09/07 0700 In: 100 [P.O.:100] Out: 660 [Urine:450; Drains:210] Intake/Output this shift:    Physical exam the patient is alert and oriented.  Lab Results: No results found for this basename: WBC, HGB, HCT, PLT,  in the last 72 hours BMET No results found for this basename: NA, K, CL, CO2, GLUCOSE, BUN, CREATININE, CALCIUM,  in the last 72 hours  Studies/Results: Dg Lumbar Spine 2-3 Views  07/09/2013   *RADIOLOGY REPORT*  Clinical Data: L3-L4 and L4-L5 posterior fixation.  DG C-ARM GT 120 MIN,LUMBAR SPINE - 2-3 VIEW  Comparison:  MRI of 01/22/2013  Findings: AP and lateral views .  Trans pedicle screw fixation at L3-L5.  The L3-L4 screws appear to project superior and posterior within the vertebral bodies.  This is most apparent on the lateral view and may be partially projectional.  Maintenance of vertebral body height.  Interbody fusion material centrally positioned.  IMPRESSION: Intraoperative imaging of L3-L5 fixation.  The L3 and L4 fixation screws appear to project superficial/posterior and superior within the vertebral bodies on the lateral view.   Original Report Authenticated By: Jeronimo Greaves, M.D.   Dg C-arm Gt 120 Min  07/09/2013   *RADIOLOGY REPORT*  Clinical Data: L3-L4 and L4-L5 posterior fixation.  DG C-ARM GT 120 MIN,LUMBAR SPINE - 2-3 VIEW  Comparison:  MRI of 01/22/2013  Findings: AP and lateral views .  Trans pedicle screw fixation at L3-L5.  The L3-L4 screws  appear to project superior and posterior within the vertebral bodies.  This is most apparent on the lateral view and may be partially projectional.  Maintenance of vertebral body height.  Interbody fusion material centrally positioned.  IMPRESSION: Intraoperative imaging of L3-L5 fixation.  The L3 and L4 fixation screws appear to project superficial/posterior and superior within the vertebral bodies on the lateral view.   Original Report Authenticated By: Jeronimo Greaves, M.D.    Assessment/Plan: Postop day #2: We will continue to mobilize the patient. We may ask rehabilitation to see the patient tomorrow versus skilled nursing facility placement.  LOS: 2 days     Aliene Tamura D 07/11/2013, 12:25 PM

## 2013-07-11 NOTE — Progress Notes (Signed)
Physical Therapy Treatment Patient Details Name: Ian Estrada MRN: [id number] DOB: 1941-05-29 Today's Date: 07/11/2013 Time: 7829-5621 PT Time Calculation (min): 17 min  PT Assessment / Plan / Recommendation  History of Present Illness Pt s/p PLIF L3-4, 4--5. H/x of parkinson's and memory loss.    PT Comments   Patient making slow progress.  Agree with need for CIR.  Follow Up Recommendations  CIR     Does the patient have the potential to tolerate intense rehabilitation     Barriers to Discharge        Equipment Recommendations  None recommended by PT    Recommendations for Other Services Rehab consult  Frequency Min 5X/week   Progress towards PT Goals Progress towards PT goals: Progressing toward goals  Plan Current plan remains appropriate    Precautions / Restrictions Precautions Precautions: Back;Fall Precaution Comments: Reviewed back precautions with patient and wife. Required Braces or Orthoses: Spinal Brace Spinal Brace: Applied in sitting position Restrictions Weight Bearing Restrictions: No   Pertinent Vitals/Pain     Mobility  Bed Mobility Bed Mobility: Rolling Right;Right Sidelying to Sit;Sitting - Scoot to [place] of Bed Rolling Right: 1: +2 Total assist;With rail Rolling Right: Patient Percentage: 30% Right Sidelying to Sit: 1: +2 Total assist;With rails;HOB flat Right Sidelying to Sit: Patient Percentage: 30% Sitting - Scoot to Edge of Bed: 1: +2 Total assist Sitting - Scoot to Edge of Bed: Patient Percentage: 40% Details for Bed Mobility Assistance: Verbal cues for technique and sequenceing for mobility.  Step-by-step cues given.  Patient able to flex LLE to assist with rolling to right.  Assisted patient hand-over-hand to reach for rail with LUE to roll.  Patient then unable to process how to move to sitting - unable to let go of rail to reach to bed with LUE to assist with sitting.  Patient sat EOB x 8 minutes, leaning posteriorly with flexed trunk  and head forward.  Verbal cues to lean forward in sitting.  Applied back brace in sitting. Transfers Transfers: Sit to Stand;Stand to Sit Sit to Stand: 1: +2 Total assist;With upper extremity assist;From bed Sit to Stand: Patient Percentage: 50% Stand to Sit: 1: +2 Total assist;With upper extremity assist;With armrests;To chair/3-in-1 Stand to Sit: Patient Percentage: 60% Details for Transfer Assistance: Verbal cues for hand placement using rollator for support.  Patient with significant posterior lean in standing. Ambulation/Gait Ambulation/Gait Assistance: 1: +2 Total assist Ambulation/Gait: Patient Percentage: 60% Ambulation Distance (Feet): 10 Feet Assistive device: 4-wheeled walker Ambulation/Gait Assistance Details: Once patient began ambulating, balance improved with more forward lean.  Continued to have flexed posture. Gait Pattern: Step-through pattern;Decreased stride length;Decreased step length - right;Decreased step length - left;Shuffle;Trunk flexed Gait velocity: Slow gait speed      PT Goals (current goals can now be found in the care plan section)    Visit Information  Last PT Received On: 07/11/13 Assistance Needed: +2 History of Present Illness: Pt s/p PLIF L3-4, 4--5. H/x of parkinson's and memory loss.     Subjective Data      Cognition  Cognition Arousal/Alertness: Awake/alert Behavior During Therapy: Flat affect Overall Cognitive Status: History of cognitive impairments - at baseline Memory: Decreased recall of precautions    Balance  Balance Balance Assessed: Yes Static Sitting Balance Static Sitting - Balance Support: Bilateral upper extremity supported;Feet supported Static Sitting - Level of Assistance: 3: Mod assist Static Sitting - Comment/# of Minutes: Initially required max assist for balance, with significant posterior lean.  With  verbal cues, was able to shift weight forward over hips, requiring mod to min assist for balance. Static  Standing Balance Static Standing - Balance Support: Bilateral upper extremity supported Static Standing - Level of Assistance: 3: Mod assist Static Standing - Comment/# of Minutes: Patient with significant posterior lean.  Verbal and tactile cues to shift weight forward.  End of Session PT - End of Session Equipment Utilized During Treatment: Gait belt;Back brace Activity Tolerance: Patient limited by fatigue Patient left: in chair;with call bell/phone within reach;with family/visitor present Nurse Communication: Mobility status   GP     Geoffrey, [place]ynes 07/11/2013, 1:23 PM Durenda Hurt. Renaldo Fiddler, [facility] Acute Rehab Services Pager [phone]

## 2013-07-12 ENCOUNTER — Encounter (HOSPITAL_COMMUNITY): Payer: Self-pay | Admitting: Neurological Surgery

## 2013-07-12 LAB — BASIC METABOLIC PANEL
BUN: 22 mg/dL (ref 6–23)
CO2: 26 mEq/L (ref 19–32)
Calcium: 8.8 mg/dL (ref 8.4–10.5)
Chloride: 103 mEq/L (ref 96–112)
Creatinine, Ser: 1.05 mg/dL (ref 0.50–1.35)
Glucose, Bld: 141 mg/dL — ABNORMAL HIGH (ref 70–99)

## 2013-07-12 LAB — CBC
HCT: 34.2 % — ABNORMAL LOW (ref 39.0–52.0)
MCH: 27.8 pg (ref 26.0–34.0)
MCV: 84.2 fL (ref 78.0–100.0)
Platelets: 250 10*3/uL (ref 150–400)
RBC: 4.06 MIL/uL — ABNORMAL LOW (ref 4.22–5.81)
WBC: 12.7 10*3/uL — ABNORMAL HIGH (ref 4.0–10.5)

## 2013-07-12 NOTE — Consult Note (Signed)
Physical Medicine and Rehabilitation Consult  Reason for Consult: Back pain with radiculopathy Referring Physician: Dr. Yetta Barre.    HPI: Ian Estrada is a 72 y.o. male with Parkinson's disease with unsteady gait as well as cognitive impairment, worsening of back pain with radiation to BLE due to L3-L5 stenosis with spondylolisthesis and stenosis. Patient elected to undergo decompressive lam L3/4 and L4/5 by Dr. Yetta Barre on 07/09/13. Therapies initiated and patient with poor attention to tasks with impaired balance, poor posture and festinating gait. MD, PT, OT recommending CIR.    ROS Past Medical History  Diagnosis Date  . Dysrhythmia     afib  . Urinary frequency   . GERD (gastroesophageal reflux disease)   . Unsteady gait   . Parkinson disease   . Cancer     skin - removal  . Arthritis   . Mild cognitive impairment with memory loss     short term memory d/t parkinson's disease.   Past Surgical History  Procedure Laterality Date  . Hernia repair    . Tonsillectomy     History reviewed. No pertinent family history.  Social History:   Married. reports that he has never smoked. He does not have any smokeless tobacco history on file. He reports that he does not drink alcohol or use illicit drugs.  Allergies  Allergen Reactions  . Penicillins Other (See Comments)    Unknown reaction per wife-Childhood reaction   Medications Prior to Admission  Medication Sig Dispense Refill  . aspirin EC 81 MG tablet Take 81 mg by mouth every morning.      . B Complex Vitamins (B COMPLEX PO) Take 1 tablet by mouth daily.       . carbidopa-levodopa (SINEMET IR) 25-100 MG per tablet Take 1-1.5 tablets by mouth 3 (three) times daily. Takes 1.5 tab twice daily & 1 tab with dinner      . Cholecalciferol (VITAMIN D PO) Take 1 tablet by mouth daily.       Marland Kitchen donepezil (ARICEPT) 10 MG tablet Take 10 mg by mouth daily with supper.       Marland Kitchen FLUDROCORTISONE ACETATE PO Take 0.05 mg by mouth daily.  Fludrocortisone Acetate 0.1mg - take 0.05mg  (1/2) tab daily)      . omeprazole (PRILOSEC) 20 MG capsule Take 20 mg by mouth daily.      Marland Kitchen rOPINIRole (REQUIP) 0.5 MG tablet Take 0.5 mg by mouth daily with supper.       . traMADol (ULTRAM) 50 MG tablet Take 50 mg by mouth 2 (two) times daily as needed for pain. For pain        Home: Home Living Family/patient expects to be discharged to:: Inpatient rehab Living Arrangements: Spouse/significant other  Functional History:   Functional Status:  Mobility: Bed Mobility Bed Mobility: Rolling Right;Right Sidelying to Sit;Sitting - Scoot to [place] of Bed Rolling Right: 1: +2 Total assist;With rail Rolling Right: Patient Percentage: 30% Rolling Left: 3: Mod assist Rolling Left: Patient Percentage: 30% Right Sidelying to Sit: 1: +2 Total assist;With rails;HOB flat Right Sidelying to Sit: Patient Percentage: 30% Left Sidelying to Sit: 1: +2 Total assist Left Sidelying to Sit: Patient Percentage: 40% Sitting - Scoot to Edge of Bed: 2: Max assist (with pad) Sitting - Scoot to [place] of Bed: Patient Percentage: 40% Sit to Sidelying Left: 4: Min assist Sit to Sidelying Left: Patient Percentage: 10% Transfers Transfers: Sit to Stand;Stand to Sit Sit to Stand: 1: +2 Total assist;With upper extremity assist;From chair/3-in-1 Sit  to Stand: Patient Percentage: 50% Stand to Sit: 1: +2 Total assist;With upper extremity assist;With armrests;To bed Stand to Sit: Patient Percentage: 30% Ambulation/Gait Ambulation/Gait Assistance: 4: Min assist;3: Mod assist Ambulation/Gait: Patient Percentage: 60% Ambulation Distance (Feet): 100 Feet Assistive device: 4-wheeled walker Ambulation/Gait Assistance Details: stability assist especially as he fatigues, cues for wider stance and tall posture with forward gaze Gait Pattern: Scissoring;Shuffle;Trunk flexed;Festinating Gait velocity: Slow gait speed    ADL: ADL Upper Body Bathing: Simulated;Maximal  assistance Where Assessed - Upper Body Bathing: Unsupported sitting Lower Body Bathing: Performed;Maximal assistance Where Assessed - Lower Body Bathing: Supported sit to stand Upper Body Dressing: Performed;Maximal assistance Where Assessed - Upper Body Dressing: Unsupported sitting Lower Body Dressing: Simulated;Maximal assistance Where Assessed - Lower Body Dressing: Supported sit to stand Toilet Transfer: +2 Total assistance Toilet Transfer Method: Sit to Barista: Raised toilet seat with arms (or 3-in-1 over toilet) Equipment Used: Back brace;Gait belt;Other (comment) (rollator) Transfers/Ambulation Related to ADLs: Pt sit<>stand from chair with Rn and OT (A). pt with decr ability to ambulate backwards. Pt states "they just wont go" pt mod (A) to ambulated around bed to opposite side to complete bed mobility. Pt required max (A) to release rollator and to perform hip flexion. pt once sitting total (A) to doff brace. Pt following simple cue. "ray place your hear on the pillow and tactile input to the left ear"  ADL Comments: Pt voiding with wife holding urinal. Pt requried total (A) with urinal. Pt's wife asking if void was complete and pt reporting yes. Pt was in fact not finished and voiding on gown and briefs. pt total (A) to doff brief by tearing sides in chair. Pt mod (A) for LB hygiene. pt total (A) to tread bil LE into briefs. pt sit <>Stand with adult brief total + 2 don. Pt ambulated around bed to complete return to supine. Pt positined in side lying with pillows  Cognition: Cognition Overall Cognitive Status: History of cognitive impairments - at baseline Orientation Level: Oriented to person;Oriented to place;Oriented to situation Cognition Arousal/Alertness: Awake/alert Behavior During Therapy: Flat affect Overall Cognitive Status: History of cognitive impairments - at baseline Memory: Decreased recall of precautions  Blood pressure 138/76, pulse 68,  temperature 97.8 F (36.6 C), temperature source Oral, resp. rate 17, height 6\' 1"  (1.854 m), weight 76.6 kg (168 lb 14 oz), SpO2 98.00%.  General: oriented x3 with extra time. Flat, sleepy HEENT: Head is normocephalic, atraumatic, PERRLA, EOMI, sclera anicteric, oral mucosa pink and moist, dentition intact, ext ear canals clear,  Neck: Supple without JVD or lymphadenopathy Heart: Reg rate and rhythm. No murmurs rubs or gallops Chest: CTA bilaterally without wheezes, rales, or rhonchi; no distress Abdomen: Soft, non-tender, non-distended, bowel sounds positive. Extremities: No clubbing, cyanosis, or edema. Pulses are 2+ Skin: Clean and intact without signs of breakdown Neuro:follows simple commands with delay. cogwheeling rigidity noted. Strength in uppers 4/5. LE's are 2/5 prox in HF, KE to 3/5 distally at ankles. No gross sensory deficits. Dtr's 1+. Speech slurred, masked facies/flat affect  Musculoskeletal: poor posture, flexed in bed. Back expectedly tender. Psych: Pt's affect is appropriate. Pt is cooperative   Results for orders placed during the hospital encounter of 07/09/13 (from the past 24 hour(s))  CBC     Status: Abnormal   Collection Time    07/12/13 12:20 PM      Result Value Range   WBC 12.7 (*) 4.0 - 10.5 K/uL   RBC 4.06 (*) 4.22 -  5.81 MIL/uL   Hemoglobin 11.3 (*) 13.0 - 17.0 g/dL   HCT 40.9 (*) 81.1 - 91.4 %   MCV 84.2  78.0 - 100.0 fL   MCH 27.8  26.0 - 34.0 pg   MCHC 33.0  30.0 - 36.0 g/dL   RDW 78.2  95.6 - 21.3 %   Platelets 250  150 - 400 K/uL  BASIC METABOLIC PANEL     Status: Abnormal   Collection Time    07/12/13 12:20 PM      Result Value Range   Sodium 139  135 - 145 mEq/L   Potassium 3.8  3.5 - 5.1 mEq/L   Chloride 103  96 - 112 mEq/L   CO2 26  19 - 32 mEq/L   Glucose, Bld 141 (*) 70 - 99 mg/dL   BUN 22  6 - 23 mg/dL   Creatinine, Ser 0.86  0.50 - 1.35 mg/dL   Calcium 8.8  8.4 - 57.8 mg/dL   GFR calc non Af Amer 70 (*) >90 mL/min   GFR calc Af  Amer 82 (*) >90 mL/min   No results found.  Assessment/Plan: Diagnosis: 72 yo WM with parkinson's disease and lumbar radiculopathy s/p decompression 1. Does the need for close, 24 hr/day medical supervision in concert with the patient's rehab needs make it unreasonable for this patient to be served in a less intensive setting? Yes 2. Co-Morbidities requiring supervision/potential complications: pain, safety, bowel and bladder 3. Due to bladder management, bowel management, safety, skin/wound care, disease management, medication administration, pain management and patient education, does the patient require 24 hr/day rehab nursing? Yes 4. Does the patient require coordinated care of a physician, rehab nurse, PT (1-2 hrs/day, 5 days/week) and OT (1-2 hrs/day, 5 days/week) to address physical and functional deficits in the context of the above medical diagnosis(es)? Yes Addressing deficits in the following areas: balance, endurance, locomotion, strength, transferring, bowel/bladder control, bathing, dressing, feeding, grooming, toileting and psychosocial support 5. Can the patient actively participate in an intensive therapy program of at least 3 hrs of therapy per day at least 5 days per week? Yes 6. The potential for patient to make measurable gains while on inpatient rehab is good 7. Anticipated functional outcomes upon discharge from inpatient rehab are supervision to minimal assist with PT, minimal assist with OT, n/a with SLP. 8. Estimated rehab length of stay to reach the above functional goals is: 2 to 3 weeks 9. Does the patient have adequate social supports to accommodate these discharge functional goals? Yes 10. Anticipated D/C setting: Home 11. Anticipated post D/C treatments: HH therapy 12. Overall Rehab/Functional Prognosis: good  RECOMMENDATIONS: This patient's condition is appropriate for continued rehabilitative care in the following setting: CIR Patient has agreed to participate  in recommended program. Yes Note that insurance prior authorization may be required for reimbursement for recommended care.  Comment: Rehab RN to follow up.   Ranelle Oyster, MD, [place] [facility]     07/12/2013

## 2013-07-12 NOTE — Progress Notes (Signed)
Rehab Admissions Coordinator Note:  Patient was screened by Trish Mage for appropriateness for an Inpatient Acute Rehab Consult.  PT/OT recommending CIR.  I am not sure that [facility] would approve an inpatient rehab stay based on current diagnosis.  However, at this time, we are recommending Inpatient Rehab consult.  Trish Mage 07/12/2013, 8:29 AM  I can be reached at [phone].

## 2013-07-12 NOTE — Progress Notes (Signed)
Patient ID: Ian Estrada, male   DOB: 1941-02-23, 72 y.o.   MRN: [id number] Subjective: Patient reports very little pain. No leg pain. Denies N/T. No headache, some neck soreness.  Objective: Vital signs in last 24 hours: Temp:  [97.9 F (36.6 C)-98.6 F (37 C)] 98.6 F (37 C) (09/08 0600) Pulse Rate:  [64-80] 79 (09/08 0600) Resp:  [16-18] 16 (09/08 0600) BP: (119-153)/(64-83) 119/76 mmHg (09/08 0600) SpO2:  [96 %-99 %] 96 % (09/08 0600)  Intake/Output from previous day: 09/07 0701 - 09/08 0700 In: 600 [P.O.:600] Out: 250 [Drains:250] Intake/Output this shift:    Neurologic: Grossly normal with CDI incision, and stable PD signs such as rigidity, masked facies, bradykinesia  Lab Results: Lab Results  Component Value Date   WBC 7.8 06/30/2013   HGB 13.6 06/30/2013   HCT 41.6 06/30/2013   MCV 84.9 06/30/2013   PLT 229 06/30/2013   Lab Results  Component Value Date   INR 1.06 06/30/2013   BMET Lab Results  Component Value Date   NA 138 06/30/2013   K 4.0 06/30/2013   CL 102 06/30/2013   CO2 27 06/30/2013   GLUCOSE 82 06/30/2013   BUN 21 06/30/2013   CREATININE 1.08 06/30/2013   CALCIUM 9.4 06/30/2013    Studies/Results: No results found.  Assessment/Plan: PT/OT. SNF vs CIR.   LOS: 3 days    JONES,DAVID S 07/12/2013, 8:27 AM

## 2013-07-12 NOTE — Progress Notes (Signed)
Called to room by pt.'s wife d/t c/o dizziness while sitting in chair; BP 79/56; Pt. Put back in bed with assistance from OT. BP re-checked: 112/78. Dr. Yetta Barre notified; new orders received. Will monitor.

## 2013-07-12 NOTE — Progress Notes (Signed)
Occupational Therapy Treatment Patient Details Name: HARTLEY URTON MRN: [id number] DOB: Nov 07, 1940 Today's Date: 07/12/2013 Time: 4098-1191 OT Time Calculation (min): 34 min  OT Assessment / Plan / Recommendation  History of present illness Pt s/p PLIF L3-4, 4--5. H/x of parkinson's and memory loss.    OT comments  Pt progressing with therapy. Pt total (A) to don brace and educated on wearing brace at all time when OOB. Wife present for all education and session. Pt demonstrates posterior lean with static standing. Pt requires cues to initiate and sustain task. Pt initiated EOB sitting and demonstrates poor sustaining task at this time.  Follow Up Recommendations  CIR    Barriers to Discharge       Equipment Recommendations  Other (comment) (TBA)    Recommendations for Other Services    Frequency Min 2X/week   Progress towards OT Goals Progress towards OT goals: Progressing toward goals  Plan Discharge plan remains appropriate    Precautions / Restrictions Precautions Precautions: Back;Fall Required Braces or Orthoses: Spinal Brace Spinal Brace: Applied in sitting position Restrictions Weight Bearing Restrictions: No   Pertinent Vitals/Pain Rt side pain- did not rate Reports incr comfort positioned in chair    ADL  Toilet Transfer: +2 Total assistance Toilet Transfer: Patient Percentage: 50% Toilet Transfer Method: Sit to stand Toilet Transfer Equipment: Raised toilet seat with arms (or 3-in-1 over toilet) Equipment Used: Back brace;Gait belt;Other (comment) (rollator) Transfers/Ambulation Related to ADLs: Pt required total+2 (A) with rollator positioned. Pt total (A) to don back brace. pt with posterior lean and needs cues for upright posture. ADL Comments: Pt supine on arrival and lethargic. Pt s/p pain medication. Pt with incr arousal with mobility. pt reports dizziness. Pt with BP MAP 60 at EOB. Pt provided v/c to transfer to chair and pt continued out of room. Pt  progressed down the hall > 50 Ft with scissor gait narrowed base of support. The incr fatigue the incr scissoring    OT Diagnosis:    OT Problem List:   OT Treatment Interventions:     OT Goals(current goals can now be found in the care plan section) Acute Rehab OT Goals Patient Stated Goal: stronger OT Goal Formulation: With patient Time For Goal Achievement: 07/24/13 ADL Goals Pt Will Perform Upper Body Bathing: with min assist;sitting Pt Will Perform Lower Body Bathing: with min assist;sit to/from stand Pt Will Perform Upper Body Dressing: with min assist;sitting Pt Will Perform Lower Body Dressing: with mod assist;sit to/from stand Pt Will Transfer to Toilet: ambulating;with min assist Pt Will Perform Toileting - Clothing Manipulation and hygiene: with min assist;sit to/from stand  Visit Information  Last OT Received On: 07/12/13 Assistance Needed: +2 PT/OT Co-Evaluation/Treatment: Yes History of Present Illness: Pt s/p PLIF L3-4, 4--5. H/x of parkinson's and memory loss.     Subjective Data      Prior Functioning       Cognition  Cognition Arousal/Alertness: Awake/alert Behavior During Therapy: Flat affect Overall Cognitive Status: History of cognitive impairments - at baseline Memory: Decreased recall of precautions    Mobility  Bed Mobility Rolling Left: 3: Mod assist Left Sidelying to Sit: 1: +2 Total assist Left Sidelying to Sit: Patient Percentage: 40% Sitting - Scoot to Edge of Bed: 2: Max assist (with pad) Details for Bed Mobility Assistance: mod v/c to sequence and tactile input to sequence task. pt with delayed reaction to verbal cues. Pt holding onto bed rail and needing tactile input to release bed rail. Transfers Transfers:  Stand to Sit;Sit to Stand Sit to Stand: 1: +2 Total assist;With upper extremity assist;From bed Sit to Stand: Patient Percentage: 50% Stand to Sit: 1: +2 Total assist;With upper extremity assist;With armrests;To chair/3-in-1 Stand  to Sit: Patient Percentage: 60% Details for Transfer Assistance: v/c for hand placement and to control descend    Exercises      Balance     End of Session OT - End of Session Activity Tolerance: Patient tolerated treatment well Patient left: in chair;with call bell/phone within reach;with family/visitor present Nurse Communication: Mobility status  GO     Harolyn Rutherford 07/12/2013, 10:31 AM Pager: [phone]

## 2013-07-12 NOTE — Progress Notes (Signed)
Physical Therapy Treatment Patient Details Name: Ian Estrada MRN: [id number] DOB: 1941/05/16 Today's Date: 07/12/2013 Time: 7829-5621 PT Time Calculation (min): 31 min  PT Assessment / Plan / Recommendation  History of Present Illness Pt s/p PLIF L3-4, 4--5. H/x of parkinson's and memory loss.    PT Comments   Initially very drowsy however awoke and ambulating better today. Still needs significant assist for transfers and bed mobility.   Follow Up Recommendations  CIR     Does the patient have the potential to tolerate intense rehabilitation     Barriers to Discharge        Equipment Recommendations  None recommended by PT    Recommendations for Other Services Rehab consult  Frequency Min 5X/week   Progress towards PT Goals Progress towards PT goals: Progressing toward goals  Plan Current plan remains appropriate    Precautions / Restrictions Precautions Precautions: Fall;Back Precaution Comments: Reviewed back precautions with patient and wife. Required Braces or Orthoses: Spinal Brace Spinal Brace: Applied in sitting position Restrictions Weight Bearing Restrictions: No   Pertinent Vitals/Pain Reports min back pain    Mobility  Bed Mobility Rolling Left: 3: Mod assist Left Sidelying to Sit: 1: +2 Total assist Left Sidelying to Sit: Patient Percentage: 40% Sitting - Scoot to Edge of Bed: 2: Max assist (with pad) Details for Bed Mobility Assistance: mod v/c to sequence and tactile input to sequence task. pt with delayed reaction to verbal cues. Pt holding onto bed rail and needing tactile input to release bed rail. Transfers Sit to Stand: 1: +2 Total assist;With upper extremity assist;From bed Sit to Stand: Patient Percentage: 50% Stand to Sit: 1: +2 Total assist;With upper extremity assist;With armrests;To chair/3-in-1 Stand to Sit: Patient Percentage: 60% Details for Transfer Assistance: v/c for hand placement and to control  descend Ambulation/Gait Ambulation/Gait Assistance: 4: Min assist;3: Mod assist Ambulation Distance (Feet): 100 Feet Assistive device: 4-wheeled walker Ambulation/Gait Assistance Details: stability assist especially as he fatigues, cues for wider stance and tall posture with forward gaze Gait Pattern: Scissoring;Shuffle;Trunk flexed;Festinating      PT Goals (current goals can now be found in the care plan section) Acute Rehab PT Goals Patient Stated Goal: stronger  Visit Information  Last PT Received On: 07/12/13 Assistance Needed: +2 History of Present Illness: Pt s/p PLIF L3-4, 4--5. H/x of parkinson's and memory loss.     Subjective Data  Patient Stated Goal: stronger   Cognition  Cognition Arousal/Alertness: Awake/alert Behavior During Therapy: Flat affect Overall Cognitive Status: History of cognitive impairments - at baseline Memory: Decreased recall of precautions    Balance     End of Session PT - End of Session Equipment Utilized During Treatment: Gait belt Activity Tolerance: Patient tolerated treatment well Patient left: in chair;with call bell/phone within reach Nurse Communication: Mobility status   GP     Bluford Sedler H 07/12/2013, 11:03 AM

## 2013-07-12 NOTE — Progress Notes (Signed)
Occupational Therapy Treatment Patient Details Name: Ian Estrada MRN: [id number] DOB: [birth year] Today's Date: 07/12/2013 Time: 3086-5784 OT Time Calculation (min): 20 min  OT Assessment / Plan / Recommendation  History of present illness Pt s/p PLIF L3-4, 4--5. H/x of parkinson's and memory loss.    OT comments  Pt in chair with decr BP at this time and RN requesting OT (A) to return to supine. Pt ambulated around bed surface to return to supine position. Pt with deficits walking backward. Pt with scissor gait with forward ambulation. Pt required total (A) for LB dressing  Follow Up Recommendations  CIR    Barriers to Discharge       Equipment Recommendations  Other (comment)    Recommendations for Other Services    Frequency Min 2X/week   Progress towards OT Goals Progress towards OT goals: Progressing toward goals  Plan Discharge plan remains appropriate    Precautions / Restrictions Precautions Precautions: Back;Fall Precaution Comments: Reviewed back precautions with patient and wife. Required Braces or Orthoses: Spinal Brace Spinal Brace: Applied in sitting position Restrictions Weight Bearing Restrictions: No   Pertinent Vitals/Pain No pain reported.    ADL  Toilet Transfer: +2 Total assistance Toilet Transfer: Patient Percentage: 50% Toilet Transfer Method: Sit to stand Toilet Transfer Equipment: Raised toilet seat with arms (or 3-in-1 over toilet) Toileting - Clothing Manipulation and Hygiene: +2 Total assistance Toileting - Clothing Manipulation and Hygiene: Patient Percentage: 40% Where Assessed - Toileting Clothing Manipulation and Hygiene: Sit to stand from 3-in-1 or toilet Equipment Used: Back brace;Gait belt;Other (comment) (rollator) Transfers/Ambulation Related to ADLs: Pt sit<>stand from chair with Rn and OT (A). pt with decr ability to ambulate backwards. Pt states "they just wont go" pt mod (A) to ambulated around bed to opposite side to complete  bed mobility. Pt required max (A) to release rollator and to perform hip flexion. pt once sitting total (A) to doff brace. Pt following simple cue. "ray place your hear on the pillow and tactile input to the left ear"  ADL Comments: Pt voiding with wife holding urinal. Pt requried total (A) with urinal. Pt's wife asking if void was complete and pt reporting yes. Pt was in fact not finished and voiding on gown and briefs. pt total (A) to doff brief by tearing sides in chair. Pt mod (A) for LB hygiene. pt total (A) to tread bil LE into briefs. pt sit <>Stand with adult brief total + 2 don. Pt ambulated around bed to complete return to supine. Pt positined in side lying with pillows    OT Diagnosis:    OT Problem List:   OT Treatment Interventions:     OT Goals(current goals can now be found in the care plan section) Acute Rehab OT Goals Patient Stated Goal: stronger OT Goal Formulation: With patient Time For Goal Achievement: 07/24/13 ADL Goals Pt Will Perform Upper Body Bathing: with min assist;sitting Pt Will Perform Lower Body Bathing: with min assist;sit to/from stand Pt Will Perform Upper Body Dressing: with min assist;sitting Pt Will Perform Lower Body Dressing: with mod assist;sit to/from stand Pt Will Transfer to Toilet: ambulating;with min assist Pt Will Perform Toileting - Clothing Manipulation and hygiene: with min assist;sit to/from stand  Visit Information  Last OT Received On: 07/12/13 Assistance Needed: +2 PT/OT Co-Evaluation/Treatment: Yes History of Present Illness: Pt s/p PLIF L3-4, 4--5. H/x of parkinson's and memory loss.     Subjective Data      Prior Functioning  Cognition  Cognition Arousal/Alertness: Awake/alert Behavior During Therapy: Flat affect Overall Cognitive Status: History of cognitive impairments - at baseline Memory: Decreased recall of precautions    Mobility  Bed Mobility Rolling Left: 3: Mod assist Left Sidelying to Sit: 1: +2 Total  assist Left Sidelying to Sit: Patient Percentage: 40% Sitting - Scoot to Edge of Bed: 2: Max assist (with pad) Sit to Sidelying Left: 4: Min assist Details for Bed Mobility Assistance: pt needed (A) for BIL LE but iniitated with UB. Pt required MOD (A) to position in the middle of the bed Transfers Transfers: Stand to Sit;Sit to Stand Sit to Stand: 1: +2 Total assist;With upper extremity assist;From chair/3-in-1 Sit to Stand: Patient Percentage: 50% Stand to Sit: 1: +2 Total assist;With upper extremity assist;With armrests;To bed Stand to Sit: Patient Percentage: 30% Details for Transfer Assistance: cues to initiate descend to bed surface, pt needed cues for hip flexion    Exercises      Balance     End of Session OT - End of Session Activity Tolerance: Patient tolerated treatment well Patient left: in bed;with call bell/phone within reach;with family/visitor present Nurse Communication: Mobility status  GO     Harolyn Rutherford 07/12/2013, 11:24 AM Pager: [phone]

## 2013-07-13 DIAGNOSIS — G20 Parkinson's disease: Secondary | ICD-10-CM

## 2013-07-13 MED FILL — Heparin Sodium (Porcine) Inj 1000 Unit/ML: INTRAMUSCULAR | Qty: 30 | Status: AC

## 2013-07-13 MED FILL — Sodium Chloride IV Soln 0.9%: INTRAVENOUS | Qty: 1000 | Status: AC

## 2013-07-13 NOTE — Progress Notes (Signed)
Met w/ pt & wife to discuss possible CIR admission.  Wife, w/ pt's agreement, says that SNF is their choice for d/c plan.  SW, Irving Burton, made aware.  CIR will sign off. [phone]

## 2013-07-13 NOTE — Progress Notes (Signed)
Notified by PT of pt. with hypotensive episiode again after getting OOB. SBP dropped to 80s. PT to place back in bed. Dr. Yetta Barre paged and notified; no further orders received. Encouraged pt. To drink more. Will monitor.

## 2013-07-13 NOTE — Progress Notes (Signed)
Patient ID: Ian Estrada, male   DOB: Apr 26, 1941, 72 y.o.   MRN: [id number] Pt stable. Not much pain, no leg pain. Stable neuro exam. Await CIR vs SNF. I think wife's ultimate desire is for him to end up in a long term care facility as she cannot adequately care for him at home anymore.

## 2013-07-13 NOTE — Progress Notes (Signed)
Physical Therapy Treatment Patient Details Name: Ian Estrada MRN: [id number] DOB: August 09, 1941 Today's Date: 07/13/2013 Time: 8295-6213 PT Time Calculation (min): 35 min  PT Assessment / Plan / Recommendation  History of Present Illness Pt s/p PLIF L3-4, 4--5. H/x of parkinson's and memory loss.    PT Comments   BP low again today with pt c/o "cold sweat." Upon arrival to the room patient saturated with urine despite having on a depends and appears to be getting frustrated with having to lay in urine. I educated him and his wife on the need to call nursing when he needs to go to the bathroom and they can assist him up. RN also notified that they should be calling more. Patient now aware that even if he has an accident that he should not be waiting until someone finds him in urine and that he should call the nurses to assist him with clean up. Mobility more improved today however very limited by low BP 86/52. Assisted the patient back to bed and RN made aware. BP seems to be our biggest limiting factor for therapy at this point.  Patient appears hesitant to drink water because he doesn't want to wet the bed. Encouraged more fluid intake.   Follow Up Recommendations  CIR     Does the patient have the potential to tolerate intense rehabilitation     Barriers to Discharge        Equipment Recommendations  None recommended by PT    Recommendations for Other Services Rehab consult  Frequency Min 5X/week   Progress towards PT Goals Progress towards PT goals: Progressing toward goals (slowly)  Plan Current plan remains appropriate    Precautions / Restrictions Precautions Precautions: Back;Fall Precaution Comments: Reviewed back precautions with patient and wife. Required Braces or Orthoses: Spinal Brace Spinal Brace: Applied in sitting position Restrictions Weight Bearing Restrictions: No   Pertinent Vitals/Pain C/o dizziness and "cold sweat" BP initially (with SCDs on) was 92/56, after  approx 10 minutes we rechecked it and it had dropped to 86/58, assisted the patient back to bed, RN made aware   Mobility  Bed Mobility Bed Mobility: Rolling Left;Left Sidelying to Sit Rolling Right: 3: Mod assist Left Sidelying to Sit: HOB elevated;2: Max assist (45 degrees) Sitting - Scoot to Edge of Bed: 2: Max assist Details for Bed Mobility Assistance: facilitation for log roll, utilized elevating HOB to bring trunk upright with facilitation for follow through and assist for hand placement; allow extra time for processing an initiation Transfers Transfers: Sit to Stand;Stand to Sit;Stand Pivot Transfers Sit to Stand: 1: +2 Total assist;With upper extremity assist;From chair/3-in-1 Sit to Stand: Patient Percentage: 60% Stand to Sit: 1: +2 Total assist;With upper extremity assist;With armrests;To bed Stand to Sit: Patient Percentage: 40% Stand Pivot Transfers: 1: +2 Total assist Stand Pivot Transfers: Patient Percentage: 50% Details for Transfer Assistance: cues for initiation as well as hand placement and correct positioning for successful standing; facilitation for anterior weight shift; assist to flex and control descent; pivoted to chair without RW today because of posterior lean and lower BP Ambulation/Gait Ambulation/Gait Assistance: Not tested (comment) Ambulation/Gait Assistance Details: BP too low today 86/58 and pt symptomatic      PT Goals (current goals can now be found in the care plan section)    Visit Information  Last PT Received On: 07/13/13 Assistance Needed: +2 History of Present Illness: Pt s/p PLIF L3-4, 4--5. H/x of parkinson's and memory loss.     Subjective  Data      Cognition  Cognition Arousal/Alertness: Awake/alert Behavior During Therapy: WFL for tasks assessed/performed Overall Cognitive Status: History of cognitive impairments - at baseline    Balance  Static Standing Balance Static Standing - Level of Assistance: 3: Mod assist Static  Standing - Comment/# of Minutes: facilaitation for anterior weight shift with cues for forward gaze and tall posture; very impaired/absent righting reaction with LOB posteriorly  End of Session PT - End of Session Equipment Utilized During Treatment: Gait belt;Back brace Activity Tolerance: Treatment limited secondary to medical complications (Comment) (BP low (86/58)) Patient left: in bed;with call bell/phone within reach Nurse Communication: Mobility status;Other (comment) (low BP limiting therapy)   GP     Ian Estrada H 07/13/2013, 11:15 AM

## 2013-07-14 NOTE — Clinical Social Work Psychosocial (Signed)
Clinical Social Work Department BRIEF PSYCHOSOCIAL ASSESSMENT 07/14/2013  Patient:  Ian Estrada,Ian Estrada     Account Number:  [id number]     Admit date:  07/09/2013  Clinical Social Worker:  Sherre Lain  Date/Time:  07/12/2013 10:40 AM  Referred by:  Physician  Date Referred:  07/12/2013 Referred for  SNF Placement   Other Referral:   none.   Interview type:  Family Other interview type:   CSW spoke to pt's wife, Glenis Smoker, while pt was receiving physical therapy evaluation.    PSYCHOSOCIAL DATA Living Status:  WIFE Admitted from facility:   Level of care:   Primary support name:  Marena Chancy Primary support relationship to patient:  SPOUSE Degree of support available:   Strong.    CURRENT CONCERNS Current Concerns  Post-Acute Placement   Other Concerns:   Pt's wife concerned that she can not care for pt at home any longer.    SOCIAL WORK ASSESSMENT / PLAN CSW met with pt and pt's wife at bedside. Pt's wife stated that she was agreeable and understanding of possible SNF placement as a back-up option to CIR. Pt's wife stated that prior to admission to [facility], pt was living at home with his wife. Pt's wife noted that pt had been receiving care at home from [facility]. CSW received notice that pt's wife no longer were pursuing CIR, and would like for pt to be discharged to SNF in either [place] or [place], [place]. CSW has sent insurance clinicals to [facility] for SNF placement. CSW to continue to follow and assist with discharge planning needs.   Assessment/plan status:  Psychosocial Support/Ongoing Assessment of Needs Other assessment/ plan:   none.   Information/referral to community resources:   [place] [facility] SNF placement.    PATIENTS/FAMILYS RESPONSE TO PLAN OF CARE: Pt and pt's wife both understanding and agreeable to CSW plan of care.       Darlyn Chamber, MSW, LCSWA Clinical Social Work [phone]

## 2013-07-14 NOTE — Progress Notes (Signed)
Occupational Therapy Treatment Patient Details Name: Ian Estrada MRN: [id number] DOB: March 02, 1941 Today's Date: 07/14/2013 Time: 9528-4132 OT Time Calculation (min): 39 min  OT Assessment / Plan / Recommendation  History of present illness Pt s/p PLIF L3-4, 4--5. H/x of parkinson's and memory loss.    OT comments  Pt progressing well with therapy at this time. Wife and pt have decided that SNF level of care for longer rehab would be best due to housing situation at this time.   Follow Up Recommendations  SNF    Barriers to Discharge       Equipment Recommendations  Other (comment) (defer to SNF)    Recommendations for Other Services    Frequency Min 2X/week   Progress towards OT Goals Progress towards OT goals: Progressing toward goals  Plan Discharge plan needs to be updated    Precautions / Restrictions Precautions Precautions: Back;Fall Required Braces or Orthoses: Spinal Brace Spinal Brace: Applied in sitting position   Pertinent Vitals/Pain None reports at this time    ADL  Lower Body Dressing: +1 Total assistance Where Assessed - Lower Body Dressing: Supported sit to Pharmacist, hospital: +2 Total assistance Toilet Transfer: Patient Percentage: 50% Statistician Method: Sit to Barista: Raised toilet seat with arms (or 3-in-1 over toilet) Toileting - Clothing Manipulation and Hygiene: +2 Total assistance Toileting - Clothing Manipulation and Hygiene: Patient Percentage: 40% Where Assessed - Toileting Clothing Manipulation and Hygiene: Sit to stand from 3-in-1 or toilet Equipment Used: Back brace;Gait belt;Other (comment) (4w RW) Transfers/Ambulation Related to ADLs: PT ambulating with narrowed base of support and scissoring. Pt with tactile input to widen base of support and input to incr speed to elongate step length. Pt with incr balance for basic transfer. Pt with difficulty walking backwards. ADL Comments: Pt completed bed mobilty  observed with PT on arrival. pt completed with incr sequence. Pt with posterior lean with all transfers. Pt pushing 4w RW away with elbow extension and v/c to push down on RW for upright posture. Pt voiding bladder but unable to void bowel. Pt ambulated short distance and return to sitting on 3n1. SCD applied to bil LE per wife request. RN and tech notified pt sitting for void .    OT Diagnosis:    OT Problem List:   OT Treatment Interventions:     OT Goals(current goals can now be found in the care plan section) Acute Rehab OT Goals Patient Stated Goal: stronger OT Goal Formulation: With patient Time For Goal Achievement: 07/24/13 ADL Goals Pt Will Perform Upper Body Bathing: with min assist;sitting Pt Will Perform Lower Body Bathing: with min assist;sit to/from stand Pt Will Perform Upper Body Dressing: with min assist;sitting Pt Will Perform Lower Body Dressing: with mod assist;sit to/from stand Pt Will Transfer to Toilet: ambulating;with min assist Pt Will Perform Toileting - Clothing Manipulation and hygiene: with min assist;sit to/from stand  Visit Information  Last OT Received On: 07/14/13 Assistance Needed: +2 PT/OT Co-Evaluation/Treatment: Yes History of Present Illness: Pt s/p PLIF L3-4, 4--5. H/x of parkinson's and memory loss.     Subjective Data      Prior Functioning       Cognition  Cognition Arousal/Alertness: Awake/alert Behavior During Therapy: WFL for tasks assessed/performed Overall Cognitive Status: History of cognitive impairments - at baseline    Mobility  Bed Mobility Bed Mobility: Not assessed (see PT note observed) Transfers Transfers: Sit to Stand;Stand to Sit Sit to Stand: 1: +2 Total assist;With upper  extremity assist;From bed Sit to Stand: Patient Percentage: 60% Stand to Sit: 1: +2 Total assist;With upper extremity assist Stand to Sit: Patient Percentage: 40% Details for Transfer Assistance: cues for hand placement and weight shift onto  front of feet. Pt with posterior lean.     Exercises      Balance     End of Session OT - End of Session Activity Tolerance: Patient tolerated treatment well Patient left: with call bell/phone within reach;with family/visitor present;Other (comment) (on 3n1) Nurse Communication: Mobility status  GO     Harolyn Rutherford 07/14/2013, 12:08 PM Pager: [phone]

## 2013-07-14 NOTE — Progress Notes (Signed)
Patient ID: Ian Estrada, male   DOB: 1941/05/11, 72 y.o.   MRN: [id number] Stable . Very little pain. Awaiting SNF

## 2013-07-14 NOTE — Clinical Social Work Placement (Signed)
Clinical Social Work Department CLINICAL SOCIAL WORK PLACEMENT NOTE 07/14/2013  Patient:  Moose,Makana R  Account Number:  [id number] Admit date:  07/09/2013  Clinical Social Worker:  Irving Burton [place], LCSWA  Date/time:  07/12/2013 10:44 AM  Clinical Social Work is seeking post-discharge placement for this patient at the following level of care:   SKILLED NURSING   (*CSW will update this form in Epic as items are completed)   07/14/2013  Patient/family provided with [facility] Department of Clinical Social Works list of facilities offering this level of care within the geographic area requested by the patient (or if unable, by the patients family).  07/14/2013  Patient/family informed of their freedom to choose among providers that offer the needed level of care, that participate in Medicare, Medicaid or managed care program needed by the patient, have an available bed and are willing to accept the patient.  07/14/2013  Patient/family informed of [facility] ownership interest in [facility], as well as of the fact that they are under no obligation to receive care at this facility.  PASARR submitted to EDS on 07/14/2013 PASARR number received from EDS on 07/14/2013  FL2 transmitted to all facilities in geographic area requested by pt/family on  07/14/2013 FL2 transmitted to all facilities within larger geographic area on   Patient informed that his/her managed care company has contracts with or will negotiate with  certain facilities, including the following:     Patient/family informed of bed offers received:   Patient chooses bed at  Physician recommends and patient chooses bed at    Patient to be transferred to  on   Patient to be transferred to facility by   The following physician request were entered in Epic:   Additional Comments:  Darlyn Chamber, MSW, LCSWA Clinical Social Work [phone]

## 2013-07-14 NOTE — Progress Notes (Signed)
Physical Therapy Treatment Patient Details Name: Ian Estrada MRN: [id number] DOB: Jun 27, 1941 Today's Date: 07/14/2013 Time: 5621-3086 PT Time Calculation (min): 36 min  PT Assessment / Plan / Recommendation  History of Present Illness Pt s/p PLIF L3-4, 4--5. H/x of parkinson's and memory loss.    PT Comments   Progressing today with improved arousal and no orthostatic hypotension. Ambulating further. Still significant difficulty with sit<>stand transfers requiring 2 assist. Bed mobility improving.   Follow Up Recommendations        Does the patient have the potential to tolerate intense rehabilitation     Barriers to Discharge        Equipment Recommendations  None recommended by PT    Recommendations for Other Services    Frequency     Progress towards PT Goals Progress towards PT goals: Progressing toward goals  Plan Current plan remains appropriate    Precautions / Restrictions Precautions Precautions: Back;Fall Precaution Comments: Reviewed back precautions with patient and wife. Required Braces or Orthoses: Spinal Brace Spinal Brace: Applied in sitting position   Pertinent Vitals/Pain Denies pain   Mobility  Bed Mobility Bed Mobility: Rolling Right;Right Sidelying to Sit Rolling Right: 4: Min assist;With rail Right Sidelying to Sit: 3: Mod assist;HOB elevated;With rails Details for Bed Mobility Assistance: initiating rolling independently needing sequencing cues for efficiency and facilitation for follow through; assist to release grasp from bed rail Transfers Transfers: Sit to Stand;Stand to Sit Sit to Stand: 1: +2 Total assist;With upper extremity assist;From bed Sit to Stand: Patient Percentage: 60% Stand to Sit: 1: +2 Total assist;With upper extremity assist Stand to Sit: Patient Percentage: 40% Stand Pivot Transfers: 1: +2 Total assist;With armrests Stand Pivot Transfers: Patient Percentage: 50% Details for Transfer Assistance: cues for hand placement  and weight shift onto front of feet. Pt with posterior lean.  Ambulation/Gait Ambulation/Gait Assistance: 4: Min assist Ambulation Distance (Feet): 100 Feet Assistive device: 4-wheeled walker Ambulation/Gait Assistance Details: facilitation for anterior weight shift, assist to propel RW and engage pt in forward movement, difficulty initiating steps once stopped, facilitation for wider stance and tall posture Gait Pattern: Scissoring;Shuffle;Trunk flexed;Festinating      PT Goals (current goals can now be found in the care plan section) Acute Rehab PT Goals Patient Stated Goal: stronger  Visit Information  Last PT Received On: 07/14/13 Assistance Needed: +2 History of Present Illness: Pt s/p PLIF L3-4, 4--5. H/x of parkinson's and memory loss.     Subjective Data  Patient Stated Goal: stronger   Cognition  Cognition Arousal/Alertness: Awake/alert Behavior During Therapy: WFL for tasks assessed/performed Overall Cognitive Status: History of cognitive impairments - at baseline    Balance     End of Session PT - End of Session Equipment Utilized During Treatment: Gait belt;Back brace Activity Tolerance: Patient tolerated treatment well Patient left: Other (comment);with call bell/phone within reach;with family/visitor present (on 3in1) Nurse Communication: Mobility status   GP     Yamilett Anastos H 07/14/2013, 2:30 PM

## 2013-07-15 MED ORDER — OXYCODONE-ACETAMINOPHEN 5-325 MG PO TABS: 1.0000 | ORAL_TABLET | ORAL | Status: DC | PRN

## 2013-07-15 NOTE — Discharge Summary (Signed)
Physician Discharge Summary  Patient ID: Ian Estrada MRN: [id number] DOB/AGE: 1941/06/11 72 y.o.  Admit date: 07/09/2013 Discharge date: 07/15/2013  Admission Diagnoses:Spondylolisthesis L4-5, severe spinal stenosis L3-4 L4-5, back pain and leg pain   Discharge Diagnoses: Spondylolisthesis L4-5, severe spinal stenosis L3-4 L4-5, back pain and leg pain  Active Problems:   * No active hospital problems. *   Discharged Condition: good  Hospital Course: Mr. Ian Estrada was admitted and taken to the operating room where he underwent an uncomplicated lumbar fusion from L3-5. Post op he has done very well, ambulating without difficulty. He is tolerating a regular diet, and voiding. His wound is clean dry and without signs of infection at discharge  Consults: rehabilitation medicine  Significant Diagnostic Studies: none  Treatments: surgery: 1. Decompressive lumbar laminectomy L3-4, L4-5 requiring more work than would be required for a simple exposure of the disk for PLIF in order to adequately decompress the neural elements and address the spinal stenosis 2. Posterior lumbar interbody fusion L3-4, L4-5 using PEEK interbody cages packed with morcellized allograft and autograft 3. Posterior fixation L3-5 inclusive using Nuvasive cortical pedicle screws.   4. Intertransverse arthrodesis L3-5 using morcellized autograft and allograft   Discharge Exam: Blood pressure 100/60, pulse 77, temperature 98.6 F (37 C), temperature source Oral, resp. rate 18, height 6\' 1"  (1.854 m), weight 76.6 kg (168 lb 14 oz), SpO2 97.00%. General appearance: alert, cooperative, appears stated age and no distress Neurologic: Mental status: Alert, oriented, thought content appropriate Cranial nerves: normal Motor: parkinsons disease, hypertonic Coordination: good with medication, can walk with assistance.  Disposition: Final discharge disposition not confirmed     Medication List    STOP taking these  medications       traMADol 50 MG tablet  Commonly known as:  ULTRAM      TAKE these medications       ARICEPT 10 MG tablet  Generic drug:  donepezil  Take 10 mg by mouth daily with supper.     aspirin EC 81 MG tablet  Take 81 mg by mouth every morning.     B COMPLEX PO  Take 1 tablet by mouth daily.     carbidopa-levodopa 25-100 MG per tablet  Commonly known as:  SINEMET IR  Take 1-1.5 tablets by mouth 3 (three) times daily. Takes 1.5 tab twice daily & 1 tab with dinner     FLUDROCORTISONE ACETATE PO  Take 0.05 mg by mouth daily. Fludrocortisone Acetate 0.1mg - take 0.05mg  (1/2) tab daily)     omeprazole 20 MG capsule  Commonly known as:  PRILOSEC  Take 20 mg by mouth daily.     oxyCODONE-acetaminophen 5-325 MG per tablet  Commonly known as:  PERCOCET/ROXICET  Take 1-2 tablets by mouth every 4 (four) hours as needed.     rOPINIRole 0.5 MG tablet  Commonly known as:  REQUIP  Take 0.5 mg by mouth daily with supper.     VITAMIN D PO  Take 1 tablet by mouth daily.         Signed: Claiborne Stroble L 07/15/2013, 4:01 PM

## 2013-07-15 NOTE — Progress Notes (Signed)
Report given to [facility] from [facility]. Family (wife) called.   Sim Boast, RN 07/15/13 1751

## 2013-07-15 NOTE — Progress Notes (Signed)
I met with pt and his wife at bedside. I discussed inpt rehab venue expectations and that we had not pursued [facility] approval due to her previous discussions preferring SNF. I explained that [facility] Medicare typically would not pay for inpt rehab and also SNF rehab if pt did not progress as anticipated or if their living situation had not been clarified at that time. Couple is moving in with friends while their home is manufactured. They would like to discuss further with SW. I recommended SNF due to the above. I will discuss with SW. [phone]

## 2013-07-15 NOTE — Progress Notes (Signed)
Physical Therapy Treatment Patient Details Name: Ian Estrada MRN: [id number] DOB: 1941-10-18 Today's Date: 07/15/2013 Time: 4098-1191 PT Time Calculation (min): 24 min  PT Assessment / Plan / Recommendation  History of Present Illness Pt s/p PLIF L3-4, 4--5. H/x of parkinson's and memory loss.    PT Comments   Pt eager for mobility and able to increase ambulation distance today.  Still requires 2nd person for sit to stands.    Follow Up Recommendations  CIR     Does the patient have the potential to tolerate intense rehabilitation     Barriers to Discharge        Equipment Recommendations  None recommended by PT    Recommendations for Other Services Rehab consult  Frequency Min 5X/week   Progress towards PT Goals Progress towards PT goals: Progressing toward goals  Plan Current plan remains appropriate    Precautions / Restrictions Precautions Precautions: Back;Fall Required Braces or Orthoses: Spinal Brace Spinal Brace: Applied in sitting position Restrictions Weight Bearing Restrictions: No   Pertinent Vitals/Pain "Not bad".  Pt premedicated prior to PT.      Mobility  Bed Mobility Bed Mobility: Rolling Left;Left Sidelying to Sit;Sitting - Scoot to [place] of Bed Rolling Left: 4: Min assist;With rail Left Sidelying to Sit: 3: Mod assist;With rails Sitting - Scoot to Edge of Bed: 3: Mod assist Details for Bed Mobility Assistance: cues for log roll and sequencing.   Transfers Transfers: Sit to Stand;Stand to Sit Sit to Stand: 1: +2 Total assist;With upper extremity assist;From bed Sit to Stand: Patient Percentage: 60% Stand to Sit: 3: Mod assist;With upper extremity assist;To chair/3-in-1 Details for Transfer Assistance: cues for hand placement, anterior wt shift with sit to stand.   Ambulation/Gait Ambulation/Gait Assistance: 4: Min assist Ambulation Distance (Feet): 150 Feet Assistive device: 4-wheeled walker Ambulation/Gait Assistance Details: cues for  increased BOS and increased step length.   Gait Pattern: Scissoring;Shuffle;Trunk flexed;Festinating Stairs: No Wheelchair Mobility Wheelchair Mobility: No    Exercises     PT Diagnosis:    PT Problem List:   PT Treatment Interventions:     PT Goals (current goals can now be found in the care plan section) Acute Rehab PT Goals Time For Goal Achievement: 07/17/13 Potential to Achieve Goals: Good  Visit Information  Last PT Received On: 07/15/13 Assistance Needed: +2 (for transfer only.  ) History of Present Illness: Pt s/p PLIF L3-4, 4--5. H/x of parkinson's and memory loss.     Subjective Data      Cognition  Cognition Arousal/Alertness: Awake/alert Behavior During Therapy: WFL for tasks assessed/performed Overall Cognitive Status: History of cognitive impairments - at baseline    Balance  Balance Balance Assessed: Yes Static Standing Balance Static Standing - Balance Support: Bilateral upper extremity supported Static Standing - Level of Assistance: 3: Mod assist Static Standing - Comment/# of Minutes: pt with strong posterior lean.    End of Session PT - End of Session Equipment Utilized During Treatment: Gait belt;Back brace Activity Tolerance: Patient tolerated treatment well Patient left: in chair;with call bell/phone within reach;with family/visitor present Nurse Communication: Mobility status   GP     Sunny Schlein, [place] [phone] 07/15/2013, 2:39 PM

## 2013-07-15 NOTE — Progress Notes (Signed)
Patient ID: Ian Estrada, male   DOB: 23-Sep-1941, 72 y.o.   MRN: [id number] [name]. Not much pain. Walked in hallway today with therapy. Awaiting SNF

## 2013-07-15 NOTE — Clinical Social Work Note (Signed)
CSW met with pt and pt's wife at bedside. Pt's wife stated that although she had originally denied CIR for her husband, she has now decided that CIR is the pt and her first choice once pt is medically discharged. CSW informed pt and pt's wife that CSW will follow-up and inform CIR regarding pt's request. CSW contacted CIR regarding pt's request. CSW followed-up with MD.   SNF bed offers were presented to pt and pt's family on 07/14/2013, and insurance authorization has began for SNF placement. CSW will continue to follow and assist with discharge planning needs.  Darlyn Chamber, MSW, LCSWA Clinical Social Work [phone]

## 2013-07-15 NOTE — Progress Notes (Signed)
Placed pt on bedside commode for BM. Pt had a large brown formed stool but pt wanted to be disimpacted. Disimpacted pt; pt able to tolerate well. Will continue to monitor pt.

## 2013-07-16 ENCOUNTER — Other Ambulatory Visit: Payer: Self-pay | Admitting: *Deleted

## 2013-07-16 MED ORDER — OXYCODONE-ACETAMINOPHEN 5-325 MG PO TABS: ORAL_TABLET | ORAL | Status: DC

## 2013-07-19 ENCOUNTER — Non-Acute Institutional Stay (SKILLED_NURSING_FACILITY): Payer: Medicare Other | Admitting: Internal Medicine

## 2013-07-19 ENCOUNTER — Encounter: Payer: Self-pay | Admitting: Internal Medicine

## 2013-07-19 DIAGNOSIS — K219 Gastro-esophageal reflux disease without esophagitis: Secondary | ICD-10-CM | POA: Insufficient documentation

## 2013-07-19 DIAGNOSIS — G20A1 Parkinson's disease without dyskinesia, without mention of fluctuations: Secondary | ICD-10-CM

## 2013-07-19 DIAGNOSIS — I951 Orthostatic hypotension: Secondary | ICD-10-CM

## 2013-07-19 DIAGNOSIS — M48061 Spinal stenosis, lumbar region without neurogenic claudication: Secondary | ICD-10-CM

## 2013-07-19 DIAGNOSIS — R2681 Unsteadiness on feet: Secondary | ICD-10-CM

## 2013-07-19 DIAGNOSIS — G20 Parkinson's disease: Secondary | ICD-10-CM | POA: Insufficient documentation

## 2013-07-19 DIAGNOSIS — D72829 Elevated white blood cell count, unspecified: Secondary | ICD-10-CM

## 2013-07-19 DIAGNOSIS — R269 Unspecified abnormalities of gait and mobility: Secondary | ICD-10-CM

## 2013-07-19 DIAGNOSIS — G2581 Restless legs syndrome: Secondary | ICD-10-CM | POA: Insufficient documentation

## 2013-07-19 DIAGNOSIS — E559 Vitamin D deficiency, unspecified: Secondary | ICD-10-CM

## 2013-07-19 NOTE — Progress Notes (Signed)
Patient ID: Ian Estrada, male   DOB: 1941/02/09, 72 y.o.   MRN: [id number]  Patient on fludrocortisone for orthostatic hypotension.

## 2013-07-19 NOTE — Progress Notes (Signed)
Patient ID: Ian Estrada, male   DOB: 1941-02-10, 72 y.o.   MRN: [id number]  [facility]   Code Status: full code  Allergies  Allergen Reactions  . Penicillins Other (See Comments)    Unknown reaction per wife-Childhood reaction    Chief Complaint:   HPI:  72 y/o male patient is here for STR post hospital admission for lumbar fusion L3-L5. He was having worsening pain in his lower back and imaging showed lumbar spinal stenosis. He thus underwent decompressive lumbar laminectomy and then posterior lumbar interbody fusion. He tolerated the procedure well and is here for rehab. He was seen in his room today. He has hx of parkinson's and dementia. He is in no distress. He mentions his pain to be under control. He feels he needs extra assistance from CNA in terms of helping him in bed. He is weak and gets tired easily. No other complaints  Review of Systems  Constitutional: Positive for malaise/fatigue. Negative for fever and chills.  HENT: Negative for hearing loss, congestion, sore throat and tinnitus.   Eyes: Negative for blurred vision.  Respiratory: Negative for cough and shortness of breath.   Cardiovascular: Negative for chest pain, palpitations, orthopnea, claudication and leg swelling.  Gastrointestinal: Negative for heartburn, nausea, vomiting, abdominal pain and constipation.  Genitourinary: Negative for dysuria.  Musculoskeletal: Positive for back pain. Negative for falls.  Skin: Positive for itching and rash.  Neurological: Positive for tremors and weakness. Negative for dizziness, seizures, loss of consciousness and headaches.  Psychiatric/Behavioral: Positive for memory loss. Negative for depression.     Past Medical History  Diagnosis Date  . Dysrhythmia     afib  . Urinary frequency   . GERD (gastroesophageal reflux disease)   . Unsteady gait   . Parkinson disease   . Cancer     skin - removal  . Arthritis   . Mild cognitive impairment with memory  loss     short term memory d/t parkinson's disease.   Past Surgical History  Procedure Laterality Date  . Hernia repair    . Tonsillectomy    . Maximum access (mas)posterior lumbar interbody fusion (plif) 2 level N/A 07/09/2013    Procedure: FOR MAXIMUM ACCESS (MAS) POSTERIOR LUMBAR INTERBODY FUSION (PLIF) 2 LEVEL LUMBAR THREE-FOUR,LUMBAR FOUR-FIVE;  Surgeon: Tia Alert, MD;  Location: [facility];  Service: Neurosurgery;  Laterality: N/A;  FOR MAXIMUM ACCESS (MAS) POSTERIOR LUMBAR INTERBODY FUSION (PLIF) 2 LEVEL   Social History:   reports that he has never smoked. He does not have any smokeless tobacco history on file. He reports that he does not drink alcohol or use illicit drugs.  History reviewed. No pertinent family history.  Medications: Patient's Medications  New Prescriptions   No medications on file  Previous Medications   ASPIRIN EC 81 MG TABLET    Take 81 mg by mouth every morning.   B COMPLEX VITAMINS (B COMPLEX PO)    Take 1 tablet by mouth daily.    CARBIDOPA-LEVODOPA (SINEMET IR) 25-100 MG PER TABLET    Take 1-1.5 tablets by mouth 3 (three) times daily. Takes 1.5 tab twice daily & 1 tab with dinner   CHOLECALCIFEROL (VITAMIN D PO)    Take 1 tablet by mouth daily.    DONEPEZIL (ARICEPT) 10 MG TABLET    Take 10 mg by mouth daily with supper.    FLUDROCORTISONE ACETATE PO    Take 0.05 mg by mouth daily. Fludrocortisone Acetate 0.1mg - take 0.05mg  (  1/2) tab daily)   OMEPRAZOLE (PRILOSEC) 20 MG CAPSULE    Take 20 mg by mouth daily.   OXYCODONE-ACETAMINOPHEN (PERCOCET/ROXICET) 5-325 MG PER TABLET    Take one to two tablets by mouth every 4 hours as needed for pain   ROPINIROLE (REQUIP) 0.5 MG TABLET    Take 0.5 mg by mouth daily with supper.   Modified Medications   No medications on file  Discontinued Medications   No medications on file     Physical Exam: Filed Vitals:   07/19/13 1414  BP: 117/68  Pulse: 60  Temp: 98.5 F (36.9 C)  Resp: 18  SpO2: 96%   gen-  frail, elderly male patient on his wheelchair HEENT- no pallor, no citerus, no LAD, MMM CVS- normal s1,s2, rrr respi- CTAB abdo- bs+, soft, non tender Skin- incision site on lower back area healing well. Has erythematous macules scattered on his upper and mid back area Musculoskeletal- able to move all 4, weakness is generalized Neuro- aao x 2, pill rolling tremor present Psych- mood and affect normal    Labs reviewed: Basic Metabolic Panel:  Recent Labs  40/98/11 1040 07/12/13 1220  NA 138 139  K 4.0 3.8  CL 102 103  CO2 27 26  GLUCOSE 82 141*  BUN 21 22  CREATININE 1.08 1.05  CALCIUM 9.4 8.8   CBC:  Recent Labs  06/30/13 1040 07/12/13 1220  WBC 7.8 12.7*  NEUTROABS 5.3  --   HGB 13.6 11.3*  HCT 41.6 34.2*  MCV 84.9 84.2  PLT 229 250    Assessment/Plan  Lumbar spinal stenosis- s/p decompression and fusion. To work with therapy team for gait stability. Fall precautions. Continue current pain regimen.  Gait instability- from weakness and recent spinal stenosis. 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  will recheck cbc with diff to assess for infectious vs non infectious cause. No signs of infection. Incision site is healing well. monitor temp curve  Rash- generalized in the back area. Will have him on benadryl 25 mg q12h pr for itching for now and reassess. Will rule out eosinophilia and liver function abnormality first  Parkinson's disease- continue sinement for now and reassess  Vitamin d def- cotninue vit d supplement  Dementia- persists, continue aricept and frequent redirection. Provide assistance with ADLs as needed  gerd- continue prilosec, monitor symptoms  resless leg syndrome- continue requip for now, monitor clinically  No clear reason of patient being on fludrocortisone. Patient unable to tell. Will verify with his family. Continue for now  Family/  staff Communication: reviewed plan with patient and charge nurse   Labs/tests ordered- cbc with diff, bmp, lft

## 2013-08-26 ENCOUNTER — Non-Acute Institutional Stay: Payer: Medicare Other | Admitting: Nurse Practitioner

## 2013-08-26 ENCOUNTER — Non-Acute Institutional Stay (SKILLED_NURSING_FACILITY): Payer: Medicare Other | Admitting: Nurse Practitioner

## 2013-08-26 DIAGNOSIS — G20A1 Parkinson's disease without dyskinesia, without mention of fluctuations: Secondary | ICD-10-CM

## 2013-08-26 DIAGNOSIS — R269 Unspecified abnormalities of gait and mobility: Secondary | ICD-10-CM

## 2013-08-26 DIAGNOSIS — R2681 Unsteadiness on feet: Secondary | ICD-10-CM

## 2013-08-26 DIAGNOSIS — G3184 Mild cognitive impairment, so stated: Secondary | ICD-10-CM

## 2013-08-26 DIAGNOSIS — G20 Parkinson's disease: Secondary | ICD-10-CM

## 2013-08-26 DIAGNOSIS — R270 Ataxia, unspecified: Secondary | ICD-10-CM

## 2013-08-26 DIAGNOSIS — M48061 Spinal stenosis, lumbar region without neurogenic claudication: Secondary | ICD-10-CM

## 2013-08-26 DIAGNOSIS — K219 Gastro-esophageal reflux disease without esophagitis: Secondary | ICD-10-CM

## 2013-08-26 DIAGNOSIS — D72829 Elevated white blood cell count, unspecified: Secondary | ICD-10-CM

## 2013-08-26 NOTE — Progress Notes (Signed)
   August 26, 2013  Ian Estrada. Weinkauf DOB:  October 16, 1941 Rm:  [address] Code Status:  Full Code  Diagnoses: Unsteady gait GERD Parkinson's Spinal stenosis of lumbar region Restless leg syndrome Leukocytosis Vitamin D deficiency  Allergies:  Penicillin, reaction unknown.  HPI: Patient is a 72 year old Caucasian male who has been in the facility for strengthening and mobility. He is status post lumbar spine fusion, L3-L5. The surgery was precipitated by ongoing worsening pain in his lower back and a subsequent lumbar spinal stenosis. The patient now is ready for discharge, on 08/28/2013.  Review of systems Constitutional no report of fever or chills Head ears nose and throat no complaint of hearing loss congestion or rhinitis. Eyes no complaint of blurred vision. Respiratory no complaint of shortness of breath cardiovascular no complaint of chest pain Gastrointestinal no complaint of nausea vomiting or current constipation. Genitourinary, negative for dysuria musculoskeletal back pain improved, no falls. Neurological positive for tremors Psychiatric/behavioral, patient with some memory loss.  Past medical history: Atrial fibrillation GERD Unsteady gait Parkinson's disease, with mild memory loss. Skin cancer  Past surgical history: Hernia repair Tonsillectomy Most recently, maximum access posterior lumbar interbody fusion two-level. Level L 3, L4, L5.  Social history: The patient reports that he has never smoked or used spit (smokeless) tobacco.  He has no alcohol use and/or history of illicit drug use.  No pertinent family history.  Medications: Aspirin 81 mg per day B complex 1 tablet per day Sinemet, 25/100 mg, 1.5 tablet 3 times a day Aricept 10 mg one a day with supper.  Fludrocortisone Acetate 0.05 mg daily Prilosec 20 mg per day Requip 0.5 mg one tablet each day.  Vital signs: O2 saturation 97% Respiratory rate 16 Pulse 71 Blood pressure  139/81  Physical examination: Neat and well groomed, alert and verbally appropriate, in no apparent distress. Pupils are equally round and reactive to light. Extraocular movements are intact. Positive for red reflex. Ears with some cerumen otherwise unremarkable. Oropharynx no apparent lesions. Patient's T. in very good repair. No cervical adenopathy or thyromegaly. Carotid bruits not present. Apical pulse with a regular rate and rhythm Bilateral breath sounds are fairly completely clear. Abdomen nondistended positive bowel sounds. Bilateral lower extremities without edema. Easily palpable posterior tibial pulse. Able to do simple math, oriented to person place and time.  Most recent labs: 08/16/2013 WBC 7.8 RBC 4.5 Hemoglobin 12.2 Hematocrit 37.4 MCV 83.5 MCH 27.2 MCHC 32.6 RDW 13.5 Platelets 321  Comprehensive metabolic panel: Sodium 137 Potassium 3.8 Chloride 101 CO2 25 A gap 12 Glucose 110 BUN 16 Creatinine 1.2 BUN/CR 12.7 Calcium 9.2 Total protein 7.3 Albumin 3.7 Albumin/globulin 1.0 Globulin 3.6 Total bilirubin 0.5 ALP 85 AST 17 ALT 7   Assessment/plan Lumbar spine fusion status post decompression and fusion, will have occupational and physical therapy in the home setting to facilitate function of ADLs, mobility, and strengthening. Major function to decrease risk of fall. Intermittently, leukocytosis. We'll continue to monitor. Parkinson's disease we'll continue to monitor and continue current medications. Dementia continue current medications provide assistance as necessary. GERD continue Prilosec Restless leg syndrome continue Requip. Patient will also have monitoring by a registered nurse related to medication management.  Time spent with patient 45 minutes.

## 2013-10-21 NOTE — Progress Notes (Signed)
This encounter was created in error - please disregard.

## 2013-10-21 NOTE — Addendum Note (Signed)
Addended by: Zachery Dauer on: 10/21/2013 01:16 PM   Modules accepted: Level of Service, SmartSet

## 2013-10-21 NOTE — Progress Notes (Signed)
Date of visit 08/26/2013  Patient ID: Ian Estrada, male   DOB: 01/26/1941, 72 y.o.   MRN: [id number]                   Progress Notes      Cari Caraway, FNP at 08/26/2013  2:14 PM      Status: Signed              August 26, 2013  Ian Estrada DOB:  70-30-1942 Rm:  [address] Code Status:  Full Code  Diagnoses: Unsteady gait GERD Parkinson's Spinal stenosis of lumbar region Restless leg syndrome Leukocytosis Vitamin D deficiency  Allergies:  Penicillin, reaction unknown.  Chief Complaint  Patient presents with  . Discharge Note    HPI: Patient is a 72 year old Caucasian male who has been in the facility for strengthening and mobility. He is status post lumbar spine fusion, L3-L5. The surgery was precipitated by ongoing worsening pain in his lower back and a subsequent lumbar spinal stenosis. The patient now is ready for discharge, on 08/28/2013.  Review of systems Constitutional no report of fever or chills Head ears nose and throat no complaint of hearing loss congestion or rhinitis. Eyes no complaint of blurred vision. Respiratory no complaint of shortness of breath cardiovascular no complaint of chest pain Gastrointestinal no complaint of nausea vomiting or current constipation. Genitourinary, negative for dysuria musculoskeletal back pain improved, no falls. Neurological positive for tremors Psychiatric/behavioral, patient with some memory loss.  Past medical history: Atrial fibrillation GERD Unsteady gait Parkinson's disease, with mild memory loss. Skin cancer  Past surgical history: Hernia repair Tonsillectomy Most recently, maximum access posterior lumbar interbody fusion two-level. Level L 3, L4, L5.  Social history: The patient reports that he has never smoked or used spit (smokeless) tobacco.  He has no alcohol use and/or history of illicit drug use.  No pertinent family history.  Medications: Aspirin 81 mg per day B  complex 1 tablet per day Sinemet, 25/100 mg, 1.5 tablet 3 times a day Aricept 10 mg one a day with supper.   Fludrocortisone Acetate 0.05 mg daily Prilosec 20 mg per day Requip 0.5 mg one tablet each day.  Vital signs: O2 saturation 97% Respiratory rate 16 Pulse 71 Blood pressure 139/81  Physical examination: Neat and well groomed, alert and verbally appropriate, in no apparent distress. Pupils are equally round and reactive to light. Extraocular movements are intact. Positive for red reflex. Ears with some cerumen otherwise unremarkable. Oropharynx no apparent lesions. Patient's T. in very good repair. No cervical adenopathy or thyromegaly. Carotid bruits not present. Apical pulse with a regular rate and rhythm Bilateral breath sounds are fairly completely clear. Abdomen nondistended positive bowel sounds. Bilateral lower extremities without edema. Easily palpable posterior tibial pulse. Able to do simple math, oriented to person place and time.  Most recent labs: 08/16/2013 WBC 7.8 RBC 4.5 Hemoglobin 12.2 Hematocrit 37.4 MCV 83.5 MCH 27.2 MCHC 32.6 RDW 13.5 Platelets 321  Comprehensive metabolic panel: Sodium 137 Potassium 3.8 Chloride 101 CO2 25 A gap 12 Glucose 110 BUN 16 Creatinine 1.2 BUN/CR 12.7 Calcium 9.2 Total protein 7.3 Albumin 3.7 Albumin/globulin 1.0 Globulin 3.6 Total bilirubin 0.5 ALP 85 AST 17 ALT 7   Assessment/plan Lumbar spine fusion status post decompression and fusion. Pt still in a rigid back brace.  His safety in ambulation is further complicated by the rigidity associated with his Parkinson's disease.     Will have occupational and physical therapy  in the home setting to facilitate function of ADLs, mobility, and strengthening.Major function to decrease risk of fall.  Intermittently, leukocytosis, without the presence of infection.   Parkinson's disease  continue current medications.  Dementia, Parkinson's related continue  current medications. Pt will need  assistance with ADL's.  PT and OT will optimalize pt's ability for self care  GERD continue Prilosec  Restless leg syndrome continue Requip.  Patient will also have monitoring by a registered nurse related to medication management, disease assessment, monitoring Blood counts, especially r/t leucocytosis.  Pt po intake to be monitored, especially fluid intake,

## 2014-02-14 ENCOUNTER — Inpatient Hospital Stay: Payer: Self-pay | Admitting: Internal Medicine

## 2014-02-14 LAB — CBC
HCT: 39.8 % — ABNORMAL LOW (ref 40.0–52.0)
HGB: 12.7 g/dL — ABNORMAL LOW (ref 13.0–18.0)
MCH: 26.4 pg (ref 26.0–34.0)
MCHC: 31.9 g/dL — ABNORMAL LOW (ref 32.0–36.0)
MCV: 83 fL (ref 80–100)
PLATELETS: 270 10*3/uL (ref 150–440)
RBC: 4.81 10*6/uL (ref 4.40–5.90)
RDW: 14.8 % — AB (ref 11.5–14.5)
WBC: 9.4 10*3/uL (ref 3.8–10.6)

## 2014-02-14 LAB — BASIC METABOLIC PANEL
ANION GAP: 10 (ref 7–16)
BUN: 20 mg/dL — ABNORMAL HIGH (ref 7–18)
CALCIUM: 8.8 mg/dL (ref 8.5–10.1)
CHLORIDE: 104 mmol/L (ref 98–107)
CO2: 25 mmol/L (ref 21–32)
Creatinine: 1.63 mg/dL — ABNORMAL HIGH (ref 0.60–1.30)
EGFR (Non-African Amer.): 42 — ABNORMAL LOW
GFR CALC AF AMER: 49 — AB
Glucose: 108 mg/dL — ABNORMAL HIGH (ref 65–99)
OSMOLALITY: 281 (ref 275–301)
Potassium: 3.8 mmol/L (ref 3.5–5.1)
Sodium: 139 mmol/L (ref 136–145)

## 2014-02-14 LAB — CK TOTAL AND CKMB (NOT AT ARMC)
CK, TOTAL: 93 U/L
CK, TOTAL: 90 U/L
CK-MB: 1.5 ng/mL (ref 0.5–3.6)
CK-MB: 1.8 ng/mL (ref 0.5–3.6)

## 2014-02-14 LAB — TROPONIN I

## 2014-02-15 LAB — BASIC METABOLIC PANEL
ANION GAP: 5 — AB (ref 7–16)
BUN: 20 mg/dL — ABNORMAL HIGH (ref 7–18)
CALCIUM: 8.2 mg/dL — AB (ref 8.5–10.1)
Chloride: 110 mmol/L — ABNORMAL HIGH (ref 98–107)
Co2: 27 mmol/L (ref 21–32)
Creatinine: 1.25 mg/dL (ref 0.60–1.30)
EGFR (African American): >60
EGFR (Non-African Amer.): 58 — ABNORMAL LOW
Glucose: 97 mg/dL (ref 65–99)
OSMOLALITY: 286 (ref 275–301)
POTASSIUM: 3.6 mmol/L (ref 3.5–5.1)
SODIUM: 142 mmol/L (ref 136–145)

## 2014-02-15 LAB — CBC WITH DIFFERENTIAL/PLATELET
BASOS ABS: 0 10*3/uL (ref 0.0–0.1)
BASOS PCT: 0.4 %
Eosinophil #: 0.2 10*3/uL (ref 0.0–0.7)
Eosinophil %: 2.4 %
HCT: 36.4 % — AB (ref 40.0–52.0)
HGB: 12.1 g/dL — ABNORMAL LOW (ref 13.0–18.0)
LYMPHS ABS: 1.5 10*3/uL (ref 1.0–3.6)
LYMPHS PCT: 21 %
MCH: 27.1 pg (ref 26.0–34.0)
MCHC: 33.1 g/dL (ref 32.0–36.0)
MCV: 82 fL (ref 80–100)
MONO ABS: 0.6 x10 3/mm (ref 0.2–1.0)
Monocyte %: 7.7 %
Neutrophil #: 5 10*3/uL (ref 1.4–6.5)
Neutrophil %: 68.5 %
Platelet: 245 10*3/uL (ref 150–440)
RBC: 4.46 10*6/uL (ref 4.40–5.90)
RDW: 14.9 % — AB (ref 11.5–14.5)
WBC: 7.4 10*3/uL (ref 3.8–10.6)

## 2014-02-15 LAB — CK TOTAL AND CKMB (NOT AT ARMC)
CK, Total: 89 U/L
CK-MB: 1.7 ng/mL (ref 0.5–3.6)

## 2014-02-15 LAB — TROPONIN I: TROPONIN-I: 0.02 ng/mL

## 2014-02-16 LAB — BASIC METABOLIC PANEL
Anion Gap: 4 — ABNORMAL LOW (ref 7–16)
BUN: 15 mg/dL (ref 7–18)
CHLORIDE: 110 mmol/L — AB (ref 98–107)
Calcium, Total: 8.5 mg/dL (ref 8.5–10.1)
Co2: 29 mmol/L (ref 21–32)
Creatinine: 1.16 mg/dL (ref 0.60–1.30)
EGFR (Non-African Amer.): >60
GLUCOSE: 94 mg/dL (ref 65–99)
Osmolality: 286 (ref 275–301)
Potassium: 3.6 mmol/L (ref 3.5–5.1)
Sodium: 143 mmol/L (ref 136–145)

## 2014-04-20 ENCOUNTER — Inpatient Hospital Stay: Payer: Self-pay | Admitting: Internal Medicine

## 2014-04-20 LAB — CBC
HCT: 38.8 % — AB (ref 40.0–52.0)
HGB: 12.7 g/dL — AB (ref 13.0–18.0)
MCH: 27.2 pg (ref 26.0–34.0)
MCHC: 32.9 g/dL (ref 32.0–36.0)
MCV: 83 fL (ref 80–100)
PLATELETS: 258 10*3/uL (ref 150–440)
RBC: 4.69 10*6/uL (ref 4.40–5.90)
RDW: 14.8 % — ABNORMAL HIGH (ref 11.5–14.5)
WBC: 8.3 10*3/uL (ref 3.8–10.6)

## 2014-04-20 LAB — URINALYSIS, COMPLETE
BLOOD: NEGATIVE
Bacteria: NONE SEEN
Bilirubin,UR: NEGATIVE
Glucose,UR: NEGATIVE mg/dL (ref 0–75)
Leukocyte Esterase: NEGATIVE
Nitrite: NEGATIVE
Ph: 5 (ref 4.5–8.0)
Protein: NEGATIVE
RBC, UR: NONE SEEN /HPF (ref 0–5)
SPECIFIC GRAVITY: 1.015 (ref 1.003–1.030)
WBC UR: NONE SEEN /HPF (ref 0–5)

## 2014-04-20 LAB — CK TOTAL AND CKMB (NOT AT ARMC)
CK, Total: 64 U/L
CK-MB: 1.2 ng/mL (ref 0.5–3.6)

## 2014-04-20 LAB — COMPREHENSIVE METABOLIC PANEL
ALK PHOS: 69 U/L
ALT: 11 U/L — AB (ref 12–78)
AST: 27 U/L (ref 15–37)
Albumin: 3.5 g/dL (ref 3.4–5.0)
Anion Gap: 7 (ref 7–16)
BUN: 25 mg/dL — ABNORMAL HIGH (ref 7–18)
Bilirubin,Total: 0.4 mg/dL (ref 0.2–1.0)
CO2: 27 mmol/L (ref 21–32)
CREATININE: 1.53 mg/dL — AB (ref 0.60–1.30)
Calcium, Total: 8.7 mg/dL (ref 8.5–10.1)
Chloride: 102 mmol/L (ref 98–107)
EGFR (Non-African Amer.): 45 — ABNORMAL LOW
GFR CALC AF AMER: 53 — AB
GLUCOSE: 80 mg/dL (ref 65–99)
Osmolality: 275 (ref 275–301)
Potassium: 3.9 mmol/L (ref 3.5–5.1)
SODIUM: 136 mmol/L (ref 136–145)
Total Protein: 7.5 g/dL (ref 6.4–8.2)

## 2014-04-20 LAB — DIGOXIN LEVEL: DIGOXIN: 1.55 ng/mL

## 2014-04-20 LAB — TROPONIN I
TROPONIN-I: 0.03 ng/mL
Troponin-I: 0.06 ng/mL — ABNORMAL HIGH

## 2014-04-21 LAB — TROPONIN I: Troponin-I: 0.05 ng/mL

## 2014-04-22 ENCOUNTER — Ambulatory Visit: Payer: Self-pay | Admitting: Neurology

## 2014-04-22 LAB — BASIC METABOLIC PANEL
Anion Gap: 5 — ABNORMAL LOW (ref 7–16)
BUN: 11 mg/dL (ref 7–18)
CHLORIDE: 107 mmol/L (ref 98–107)
CO2: 29 mmol/L (ref 21–32)
Calcium, Total: 8.8 mg/dL (ref 8.5–10.1)
Creatinine: 1.16 mg/dL (ref 0.60–1.30)
GLUCOSE: 101 mg/dL — AB (ref 65–99)
Osmolality: 281 (ref 275–301)
Potassium: 3.5 mmol/L (ref 3.5–5.1)
SODIUM: 141 mmol/L (ref 136–145)

## 2014-06-07 ENCOUNTER — Ambulatory Visit: Payer: Self-pay | Admitting: Ophthalmology

## 2014-06-28 ENCOUNTER — Ambulatory Visit: Payer: Self-pay | Admitting: Ophthalmology

## 2015-02-25 NOTE — Op Note (Signed)
PATIENT NAME:  Ian Estrada, Ian Estrada MR#:  [id number] DATE OF BIRTH:  [birth year]  DATE OF PROCEDURE:  06/07/2014  PREOPERATIVE DIAGNOSIS: Visually significant cataract of the left eye.   POSTOPERATIVE DIAGNOSIS: Visually significant cataract of the left eye.   OPERATIVE PROCEDURE: Cataract extraction by phacoemulsification with implant of intraocular lens to left eye.   SURGEON: Birder Robson, MD.   ANESTHESIA:  1. Managed anesthesia care.  2. Topical tetracaine drops followed by 2% Xylocaine jelly applied in the preoperative holding area.   COMPLICATIONS: None.   TECHNIQUE:  Stop and chop.  DESCRIPTION OF PROCEDURE: The patient was examined and consented in the preoperative holding area where the aforementioned topical anesthesia was applied to the left eye and then brought back to the operating room where the left eye was prepped and draped in the usual sterile ophthalmic fashion and a lid speculum was placed. A paracentesis was created with the side port blade and the anterior chamber was filled with viscoelastic. A near clear corneal incision was performed with the steel keratome. A continuous curvilinear capsulorrhexis was performed with a cystotome followed by the capsulorrhexis forceps. Hydrodissection and hydrodelineation were carried out with BSS on a blunt cannula. The lens was removed in a stop and chop technique and the remaining cortical material was removed with the irrigation-aspiration handpiece. The capsular bag was inflated with viscoelastic and the Tecnis ZCB00  16.0-diopter lens, serial number [id number] was placed in the capsular bag without complication. The remaining viscoelastic was removed from the eye with the irrigation-aspiration handpiece. The wounds were hydrated. The anterior chamber was flushed with Miostat and the eye was inflated to physiologic pressure. 0.1 mL of cefuroxime concentration 10 mg/mL was placed in the anterior chamber. The wounds were found to be water  tight. The eye was dressed with Vigamox. The patient was given protective glasses to wear throughout the day and a shield with which to sleep tonight. The patient was also given drops with which to begin a drop regimen today and will follow-up with me in one day.    ____________________________ Livingston Diones. Vernor Monnig, MD wlp:jh D: 06/07/2014 22:50:09 ET T: 06/08/2014 05:20:22 ET JOB#: [id number]  cc: Nahome Bublitz L. Derry Arbogast, MD, <Dictator> Livingston Diones Chanceler Pullin MD ELECTRONICALLY SIGNED 06/08/2014 16:34

## 2015-02-25 NOTE — Discharge Summary (Signed)
PATIENT NAME:  Ian Estrada, Ian Estrada MR#:  [id number] DATE OF BIRTH:  1940/12/20  DATE OF ADMISSION:  04/20/2014 DATE OF DISCHARGE:  04/22/2014  DISCHARGE DIAGNOSES: 1.  Encephalopathy. 2.  Dementia.  3.  Acute renal failure secondary to dehydration.   DISCHARGE MEDICATIONS: Per [facility] med reconciliation system. Basically, he will be on his home meds without change, unless altered by neurology consult that is at this time still pending from admission.   HISTORY AND PHYSICAL: Please see detailed history and physical done on admission.   HOSPITAL COURSE: The patient was admitted after having staring spells and being more confused with elevated creatinine from baseline at 1.5. He was hydrated and watched on telemetry and neurology consult was ordered. Creatinine was 1.53. Level from this morning is pending at this point. Digoxin level was 1.55, which is high normal, perhaps too high for his age and given the fact I do not have a clear history on why dig was indicated i will not decrease his dose at this point, but this should be considered as an outpatient soon at follow-up with his primary care doctor, Dr. Emily Filbert, next week. Troponin levels were not elevated. CK and CK-MB were very low. One troponin level did go to 0.06, which is minimally abnormal, but it was 1 check and it came down. He had no chest pain, no shortness of breath at all during this time so that is likely spurious. Given the overall situation, I do not think that further aggressive work-up at this point would be beneficial for Mr. Duce. His UA was normal. He is being set up to be discharged home pending neurology approval with a consultation, specifically to determine whether his episodes of staring spells could potentially be a seizure disorder.  ____________________________ Ocie Cornfield. Ouida Sills, MD mwa:sb D: 04/22/2014 07:24:49 ET T: 04/22/2014 08:48:10 ET JOB#: [id number]  cc: Ocie Cornfield. Ouida Sills, MD, <Dictator> Kirk Ruths  MD ELECTRONICALLY SIGNED 04/23/2014 10:35

## 2015-02-25 NOTE — H&P (Signed)
PATIENT NAME:  Ian Estrada, Ian Estrada MR#:  [id number] DATE OF BIRTH:  1941/09/16  DATE OF ADMISSION:  04/20/2014  REFERRING PHYSICIAN:  Dr. Jasmine December   PRIMARY CARE PHYSICIAN:  Dr. Emily Filbert, [facility]   CHIEF COMPLAINT:  Staring off into space.   HISTORY OF PRESENT ILLNESS:  A 74 year old Caucasian gentleman with past medical history of Parkinson's, atrial fibrillation, gastroesophageal reflux disease, presenting with multiple episodes where he was "staring off into space." Recurrent episodes over the last week; however, today had 5-6 episodes. No preceding symptoms during these episodes which is confirmed by his family at bedside.  The patient stares off into space, his body goes limp, had loss of bowel and bladder function, though he has no postictal confusion. Each episode is lasting about 30-60 seconds in total. The patient himself mentions having chest pain which occurred yesterday, retrosternal in location with pressure and quality, 4-5 out of 10 in intensity, lasting about 2 hours without radiation. No worsening or relieving factors. No further chest pain episodes. As far as the patient is concerned, he has no preceding symptoms and he actually does not recall any of the events. He does denote being more tired than usual.   REVIEW OF SYSTEMS:  CONSTITUTIONAL: Positive for fatigue, weakness. Denies fevers, chills.  EYES: Denied blurred vision, double vision, or eye pain.  EARS, NOSE, THROAT: Denies tinnitus, ear pain, hearing loss.  RESPIRATORY: Denies cough, wheeze, shortness of breath.  CARDIOVASCULAR:  Positive for chest pain as described above. Denies any palpitations, edema.  GASTROINTESTINAL: Denies nausea, vomiting, diarrhea, abdominal pain.  GENITOURINARY: Denies dysuria, hematuria.  ENDOCRINE: Denies nocturia or thyroid problems.  HEMATOLOGIC AND LYMPHATIC: Denies easy bruising, bleeding.  SKIN: Denies rash or lesions.  MUSCULOSKELETAL: Denies any pain in neck, back, shoulder,  knees, hips, or arthritic symptoms.  NEUROLOGIC: Denies any paralysis or paresthesias, dysarthria.   PSYCHIATRIC: Denies any anxiety or depressive symptoms.   Otherwise, full review of systems performed by me is negative.   PAST MEDICAL HISTORY: Atrial fibrillation, gastroesophageal reflux disease, as well as Parkinson's disease.   ALLERGIES: PENICILLIN.   HOME MEDICATIONS: Include fludrocortisone 0.1 mg half tablet b.i.d.; aspirin 81 mg p.o. daily; digoxin 125 mcg p.o. b.i.d.; carbidopa levodopa 10 mg/100 mg 1.5 tablets in the morning and at lunch time, 1 tablet in the evening; Aricept 10 mg p.o. at bedtime; Prilosec 20 mg p.o. daily; vitamin B complex 1 tablet p.o. daily.   PHYSICAL EXAMINATION:  VITAL SIGNS: Temperature 98, heart rate 63, respirations 18, blood pressure 127/70, saturating 98% on room air. Weight 81.6 kg, BMI 24.4.  GENERAL: Well-nourished, well-developed Caucasian gentleman, currently in no acute distress.  HEAD: Normocephalic, atraumatic.  EYES: Pupils equal, round, reactive to light. Extraocular muscles intact. No scleral icterus.  MOUTH: Moist mucous membranes. Dentition intact. No abscess noted. EARS, NOSE, AND THROAT:  Clear without exudates. No external lesions.  NECK: Supple. No thyromegaly. No nodules. No JVD.  PULMONARY: Clear to auscultation bilaterally without wheezes, rubs or rhonchi. No use of accessory muscles. Good respiratory effort.  CHEST: Nontender to palpation.  CARDIOVASCULAR: S1, S2, irregular rate, irregular rhythm with a 2/6 systolic ejection murmur best heard at right upper sternal border. No edema. Pedal pulses 2+ bilaterally.  GASTROINTESTINAL: Soft, nontender, nondistended. No masses. Positive bowel sounds. No hepatosplenomegaly.  MUSCULOSKELETAL: No swelling, clubbing, or edema. Range of motion full in all extremities.  NEUROLOGIC: Baseline resting tremor, however cranial nerves II through XII intact. Sensation intact. Reflexes intact.  Pronator drift within normal  limits. Strength 5/5 in all extremities, including proximal and distal flexion and extension.  SKIN: No ulceration, lesion, rash or cyanosis. Skin warm, dry. Turgor intact.  PSYCHIATRIC: Mood and affect within normal limits. The patient is awake, alert, oriented x 3. Insight and judgment intact.   LABORATORY DATA: EKG performed atrial fibrillation, heart rate in the 60s. He had a head CT performed which reveals chronic diffuse atrophy, no acute intracranial process. Chest x-ray performed. No acute cardiopulmonary process.   REMAINDER OF LABORATORY DATA: Sodium 136, potassium 3.9, chloride 102, bicarbonate 27, BUN 25, creatinine 1.53, glucose 80. LFTs within normal limits. Troponin I less than 0.03, digoxin 1.55. WBC 8.3, hemoglobin 12.7, platelets of 258,000. Urinalysis negative for evidence of infection.   ASSESSMENT AND PLAN: A 74 year old gentleman with history of Parkinson as well as atrial fibrillation, presenting with episodes representing multiple episodes of staring off into space.  1. Encephalopathy. Multiple episodes.  It sounds like these are potential seizure activity. We will check an EEG and check a prolactin level as that may indicate if elevated prior to seizure activity. If either of these positive, we will get neurology's input.  2. Chest pain. Admit to telemetry. Received aspirin therapy. Trend cardiac enzymes x 3.  3. Acute kidney injury. Gentle IV fluid hydration. We will follow urine output and renal function.  4. Gastroesophageal reflux disease. Continue with PPI therapy.  5. Parkinson's. Continue with Sinemet and donepezil.  6. Venous thromboembolism prophylaxis with heparin subcutaneously.   CODE STATUS: Patient is full code.   TIME SPENT: 45 minutes.    ____________________________ Aaron Mose. Hower, MD dkh:dd D: 04/20/2014 20:45:09 ET T: 04/20/2014 21:11:47 ET JOB#: [id number]  cc: Aaron Mose. Hower, MD, <Dictator> DAVID Woodfin Ganja  MD ELECTRONICALLY SIGNED 04/21/2014 20:25

## 2015-02-25 NOTE — Consult Note (Signed)
PATIENT NAME:  Ian, Estrada MR#:  [id number] DATE OF BIRTH:  07-09-1941  DATE OF CONSULTATION:  04/22/2014  REFERRING PHYSICIAN:   CONSULTING PHYSICIAN:  Leotis Pain, MD  REASON FOR CONSULTATION: Syncope versus seizure. Most of the information was obtained from chart and the patient's wife is at bedside.   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 No tongue biting. No postictal confusion.   On initial presentation, the patient had an episode of chest pain which has resolved. No history of seizures in the past. No family history of seizures. The patient follows with a neurologist at the [facility] clinic for his history of Parkinson-Plus syndrome.   REVIEW OF SYSTEMS: Difficult to obtain due to progressive dementia with Parkinsonian disease, but is positive for fatigue, weakness. No blurry vision. No tinnitus. No hearing loss. No cough. No wheezing. Resolution of chest pain. No nausea. No vomiting. No dysuria. No hematuria. No heat or cold intolerance. No weakness on one side of the body compared to the other. Denies any anxiety and depression.   PAST MEDICAL HISTORY: Significant for atrial fibrillation, gastroesophageal reflux disease, and Parkinsonian-Plus syndrome.   HOME MEDICATIONS: Include fludrocortisone 0.1 mg 1/2 tablet twice a day, aspirin 81, digoxin, Sinemet, Aricept, Prilosec.  DIAGNOSTIC DATA: The patient's imaging and laboratory work-up has been reviewed.   PHYSICAL EXAMINATION: VITAL SIGNS: Include blood pressure orthostatics: 121/70 while lying down with a heart  rate of 71, blood pressure while sitting was 106/73 with heart rate of 80, blood pressure standing 78/47 with heart rate of 81. Fits into category of orthostasis.  NEUROLOGIC: The patient is alert to his name. Could not tell me the date, time and the reason why he is in the hospital. This is apparently at baseline, as per the patient's wife.  Speech appears to be slow. No signs of dysarthria or aphasia noted. The patient has masking of the face consistent with parkinsonian syndrome. Extraocular movements are intact. Pupils 3 to 2 reactive bilaterally. Extraocular movements are intact. Facial sensation intact. Facial motor intact. Tongue is midline. Uvula elevates symmetrically. Shoulder shrug intact. Motor is 4+/5 bilateral upper and lower extremities. Reflexes present and symmetrical. Gait not assessed. Coordination is slow, but intact in bilateral upper and lower extremities.   IMPRESSION: A 74 year old gentleman with history of Parkinson-Plus syndrome, question of Shy-Drager syndrome with past medical history of atrial fibrillation, presenting with periods of confusion lasting about for a half a minute, syncope versus seizure activity, no postictal state but positive for urine and bowel incontinence.   I suspect these are syncopal episodes rather than seizure activity and no previous history of seizures in the past. Positive for orthostasis. 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 These patients present with severe orthostasis worsened by the current medications that are anti-Parkinson medications that can present with orthostasis themselves while taking them.   PLAN: The patient is status post EEG. I would not wait for official results prior to discharge. I think it is safe to discharge the patient from a neurological standpoint  The patient has a [place] appointment in  about a week with his neurologist. The case was discussed with the patient's wife and she was told that she can obtain official EEG results probably early next week by acquiring medical records. I would not start any antiepileptics right now. Would strongly consider increasing the Florinef dose or adding midodrine to this patient.   Thank you, it was a pleasure seeing this patient. Please call with any questions.   ____________________________ Leotis Pain, MD yz:sb D: 04/22/2014 14:18:33 ET T: 04/22/2014 16:28:00 ET JOB#: [id number]  cc: Leotis Pain, MD, <Dictator> Leotis Pain MD ELECTRONICALLY SIGNED 05/17/2014 14:53

## 2015-02-25 NOTE — Discharge Summary (Signed)
PATIENT NAME:  Ian Estrada, Ian Estrada MR#:  [id number] DATE OF BIRTH:  [birth year]  DATE OF ADMISSION:  02/14/2014 DATE OF DISCHARGE:  02/16/2014   DISCHARGE DIAGNOSES:  1. Atrial fibrillation, uncontrolled rate, with associated hypotension.  2. Parkinson's disease.  3. Mild dementia.   DISCHARGE MEDICATIONS:  1. Aricept 10 mg at bedtime.  2. Aspirin 81 mg daily. 3. Super B daily.  4. Omeprazole 20 mg daily.  5. Carbidopa/levodopa 10/100 mg 1-1/2 tabs a.m., 1-1/2 tabs at lunch, 1 tab at bedtime. 6. Fludrocortisone 0.1 mg 1/2 tab b.i.d. 7. Digoxin 125 mcg daily.  8. Toprol-XL 25 mg daily.   REASON FOR ADMISSION: A 74 year old male who presents with hypotension and atrial fibrillation. Please see H and P for HPI, past medical history and physical exam.   HOSPITAL COURSE: The patient was admitted, hydrated. His blood pressure came up. His heart rate was decreased with Toprol-XL and digoxin. Careful monitoring occurred in light of his Parkinson's to avoid hypotension and orthostasis; that did not seem to occur. Over the weekend, if his heart rate stays above 90, he will go to Toprol-XL b.i.d. 25 mg. He is not an anticoagulation candidate with his risk for falls, but will stay on aspirin 81 mg daily. Ultimately, if he remains in atrial fibrillation, could put him on Coumadin or Xarelto with plans for DC cardioversion. Overall prognosis is guarded. One week followup.    ____________________________ Rusty Aus, MD mfm:lb D: 02/16/2014 07:44:23 ET T: 02/16/2014 07:49:18 ET JOB#: [id number]  cc: Rusty Aus, MD, <Dictator> Maelee Hoot Roselee Culver MD ELECTRONICALLY SIGNED 02/16/2014 8:05

## 2015-02-25 NOTE — Op Note (Signed)
PATIENT NAME:  Ian Estrada, Ian Estrada MR#:  [id number] DATE OF BIRTH:  Jul 26, 1941  DATE OF PROCEDURE:  06/28/2014  PREOPERATIVE DIAGNOSIS: Visually significant cataract of the right eye.   POSTOPERATIVE DIAGNOSIS: Visually significant cataract of the right eye.   OPERATIVE PROCEDURE: Cataract extraction by phacoemulsification with implant of intraocular lens to the right eye.   SURGEON: Birder Robson, MD.   ANESTHESIA:  1. Managed anesthesia care.  2. Topical tetracaine drops followed by 2% Xylocaine jelly applied in the preoperative holding area.   COMPLICATIONS: None.   TECHNIQUE:  Stop and chop.  DESCRIPTION OF PROCEDURE: The patient was examined and consented in the preoperative holding area where the aforementioned topical anesthesia was applied to the right eye and then brought back to the Operating Room where the right eye was prepped and draped in the usual sterile ophthalmic fashion and a lid speculum was placed. A paracentesis was created with the side port blade and the anterior chamber was filled with viscoelastic. A near clear corneal incision was performed with the steel keratome. A continuous curvilinear capsulorrhexis was performed with a cystotome followed by the capsulorrhexis forceps. Hydrodissection and hydrodelineation were carried out with BSS on a blunt cannula. The lens was removed in a stop -and-chop technique and the remaining cortical material was removed with the irrigation-aspiration handpiece. The capsular bag was inflated with viscoelastic and the Tecnis ZCB00 15.0-diopter lens, serial number [id number] was placed in the capsular bag without complication. The remaining viscoelastic was removed from the eye with the irrigation-aspiration handpiece. The wounds were hydrated. The anterior chamber was flushed with Miostat and the eye was inflated to physiologic pressure. Cefuroxime was not placed within the eye due to a PENICILLIN ALLERGY; rather a 4:1 dilution of Vigamox  was placed. The wounds were found to be water tight. The eye was dressed with Vigamox. The patient was given protective glasses to wear throughout the day and a shield with which to sleep tonight. The patient was also given drops with which to begin a drop regimen today and will follow-up with me in one day.    ____________________________ Livingston Diones. Kerington Hildebrant, MD wlp:MT D: 06/28/2014 23:37:00 ET T: 06/29/2014 12:16:54 ET JOB#: [id number]  cc: Nayvie Lips L. Tiesha Marich, MD, <Dictator> Livingston Diones Verdun Rackley MD ELECTRONICALLY SIGNED 07/01/2014 16:55

## 2015-02-25 NOTE — H&P (Signed)
PATIENT NAME:  Ian Estrada, Ian Estrada MR#:  [id number] DATE OF BIRTH:  06-20-1941  DATE OF ADMISSION:  02/14/2014  PRIMARY DOCTOR:  Rusty Aus, MD  ER PHYSICIAN:  Latina Craver, MD  CHIEF COMPLAINT: Generalized weakness.   HISTORY OF PRESENT ILLNESS: A 74 year old male patient with history of Parkinson's disease, sent in from Dr. Ammie Ferrier office because of generalized weakness. The patient started to feel weak since last night associated with unable to ambulate. The patient denies any recent sickness. No fever. No cough. No nausea. No vomiting. No diarrhea. The patient went to Dr. Ammie Ferrier office and was sent here because the patient was very weak and slumped over when he was trying to walk. Heart rate was around 115 in the Emergency Room. The patient had evaluation done which showed acute renal failure and dehydration. I was asked to admit the patient.   PAST MEDICAL HISTORY: Significant for history of Parkinson's disease, atrial fibrillation and history of early dementia.   ALLERGIES: NO KNOWN ALLERGIES. THE PATIENT DID HAVE SOME RASH WITH PENICILLIN WHEN HE WAS YOUNG BUT DID NOT TAKE PENICILLIN AFTER THAT SO PENICILLIN IS LISTED IN THE ALLERGIES.   SOCIAL HISTORY: No smoking, no drinking. Lives with the wife.  Uses a walker at home. Even with the walker, the patient was unable to walk since last night.   PAST SURGICAL HISTORY: Significant for back surgery and history of hernia repair and tonsillectomy.   FAMILY HISTORY: Father had Alzheimer's. Mother lived to the age of 2.   MEDICATIONS: He has taken off digoxin for A. fib and used to see Dr. Saralyn Pilar. Other medications include Aricept 10 mg daily, aspirin 81 mg daily, carbidopa/levodopa 10/100, 1-1/2 tablets in the morning, 1-1/2 tablet at lunch and 1 tablet at night. Fludrocortisone 0.1 mg 1/2 tablet in the morning and 1/2 tablet at lunchtime.   REVIEW OF SYSTEMS:    CONSTITUTIONAL: Has no fever, no fatigue. Has generalized weakness.   EYES: No blurred vision.  ENT: No tinnitus. No epistaxis, no difficulty swallowing.  RESPIRATORY: No cough. No wheezing.  CARDIOVASCULAR: No chest pain. No orthopnea. No PND. No pedal edema.  GASTROINTESTINAL: No nausea. No vomiting. No abdominal pain.  GENITOURINARY: No dysuria. Does have frequency of urination.  GASTROINTESTINAL: No dysuria.  ENDOCRINE: No polyuria or nocturia.  HEMATOLOGIC: No anemia.  INTEGUMENTARY: No skin rashes.  MUSCULOSKELETAL: Denies joint pain.  NEUROLOGIC: Has Parkinson's disease.  PSYCHIATRIC:  No anxiety or insomnia.   PHYSICAL EXAMINATION: VITAL SIGNS: Temperature 97.5, heart rate 120, blood pressure 91/56, respiratory rate 18. Sats  98% on room air.  GENERAL:  Alert, awake, oriented 74 year old male patient not in distress HEAD:  Atraumatic, normocephalic.  EYES: Pupils are equally reacting to light. Extraocular movements are intact.  ENT: No tympanic membrane congestion. No turbinate hypertrophy. No oropharyngeal erythema.  NECK: Supple. No JVD, no carotid bruit.  Normal range of motion.  RESPIRATORY: Clear to auscultation. No wheeze. No rales.  CARDIOVASCULAR: S1, S2 regular. The patient's PMI not displaced. (peripheral  pulses are intact in dorsalis pedis,femoral artery ABDOMEN: Nontender, nondistended. Bowel sounds present. No organomegaly. No hernias.  MUSCULOSKELETAL: The patient's extremities moved x 4. No effusion.  SKIN: Inspection is normal. There is no lymphadenopathy.  VASCULAR: Good pedal pulses.  NEUROLOGIC: Cranial nerves II through XII are intact. Power 5 out of 5 upper and lower extremities.  Sensation intact.  DTRs 2+ bilaterally.   PSYCHIATRIC: Mood and affect are within normal limits.   LABORATORY, DIAGNOSTIC AND RADIOLOGICAL  DATA:   1.  Sodium is 139, potassium 3.8, chloride 104, bicarb 25, BUN 20, creatinine 1.62, glucose 108. LFTs within normal limits.  2.  Troponin less than 0.02.  3.  WBC 9.4, hemoglobin 12.7, hematocrit  39.8, platelets 270.  4.  Chest x-ray shows no acute cardiopulmonary abnormality.  5.  EKG atrial fibrillation with RVR with 131 beats per minute.   ASSESSMENT AND PLAN: 1.  The patient is a 74 year old male patient with generalized weakness, likely secondary to atrial fibrillation with rapid ventricular response and also acute renal failure. The patient is admitted to  telemetry and he was on digoxin before. I am going to consult cardiology and we will give him 0.25 mg of digoxin daily and cardiology consult with Dr. Saralyn Pilar will be obtained and also cycle cardiac markers to evaluate for any cardiac events.  2.  Acute renal failure. The patient's renal function was normal 2 years ago.  He is on fludrocortisone and acute renal failure probably secondary to hypertension and prerenal failure. Continue fluids and monitor kidney function.  3.  Hypertension. Continue fluids and also continue fludrocortisone.  4.  Parkinson's disease. Continue carbidopa/levodopa.  5.  Mild dementia. Continue Aricept.  6.  Gastrointestinal prophylaxis with proton pump inhibitors.  7.  Regarding numbness, weakness, will consult physical therapy.   TIME SPENT ON HISTORY AND PHYSICAL: About 60 minutes. Will sign off to Dr. Emily Filbert.    ____________________________ Epifanio Lesches, MD sk:cs D: 02/14/2014 17:35:00 ET T: 02/14/2014 18:50:47 ET JOB#: [id number]  cc: Epifanio Lesches, MD, <Dictator>   Epifanio Lesches MD ELECTRONICALLY SIGNED 03/08/2014 13:52

## 2015-02-25 NOTE — Consult Note (Signed)
PATIENT NAME:  Ian Estrada, Ian Estrada MR#:  [id number] DATE OF BIRTH:  11/16/1940  DATE OF CONSULTATION:  02/15/2014  REFERRING PHYSICIAN:   CONSULTING PHYSICIAN:  Isaias Cowman, MD  PRIMARY CARE PHYSICIAN: Dr. Sabra Heck.   CHIEF COMPLAINT: Weakness.   REASON FOR CONSULTATION: Consultation requested for evaluation of atrial fibrillation.   HISTORY OF PRESENT ILLNESS: The patient is a 74 year old gentleman with known Parkinson's disease. The patient went to see Dr. Sabra Heck yesterday because of generalized weakness. The patient apparently was very weak, slumped over in his chair and was noted to be in atrial fibrillation with a rate of 115 bpm. Admission labs were notable for BUN and creatinine of 20 and 1.63, respectively. Troponin was less than 0.02. The patient was admitted to telemetry. The patient reports some overall clinical improvement with intravenous fluid resuscitation. The patient currently is on aspirin for stroke prevention. He was restarted on digoxin 0.125 mg daily and metoprolol succinate 25 mg daily. The patient's heart rate today is 104. The patient has a normal blood pressure 128/88. The patient still complained of generalized weakness.   PAST MEDICAL HISTORY:  1.  Parkinson's.  2.  Atrial fibrillation.  3.  Dementia.   MEDICATIONS: Aspirin 81 mg daily, Aricept 10 mg daily, carbidopa/levodopa 10/100, 1/2 tablets q.a.m., 1/2 tablet at lunch and 1 tablet at bedtime and fludrocortisone 0.1 mg 1/2 tablet q.a.m. and 1/2 tablet at lunch.   SOCIAL HISTORY: The patient is married and resides with his wife. He uses a walker at home.   FAMILY HISTORY: No immediate family history of coronary artery disease or myocardial infarction.   REVIEW OF SYSTEMS: CONSTITUTIONAL: No fever or chills. The patient has generalized weakness. EYES: No blurry vision. EARS: No hearing loss. RESPIRATORY: No shortness of breath. CARDIOVASCULAR: The patient denies chest pain or palpitations. GASTROINTESTINAL: No  nausea, vomiting, or diarrhea. GENITOURINARY: No dysuria or hematuria. ENDOCRINE: No polyuria or polydipsia. MUSCULOSKELETAL: No arthralgias or myalgias. NEUROLOGICAL: No focal muscle weakness or numbness. PSYCHOLOGICAL: No depression or anxiety.   PHYSICAL EXAMINATION:  VITAL SIGNS: Blood pressure 128/88, pulse 104 and irregularly irregular, respirations 18, temperature 97.6, pulse oximetry 95%.  HEENT: Pupils equal and reactive to light and accommodation.  NECK: Supple without thyromegaly.  LUNGS: Clear.  HEART: Normal JVP. Normal PMI. Irregularly irregular rhythm. Normal S1, S2. No appreciable gallop, murmur, or rub.  ABDOMEN: Soft and nontender. Pulses were intact bilaterally.  MUSCULOSKELETAL: Normal muscle tone.  NEUROLOGIC: The patient is alert and oriented x 3. Motor and sensory both grossly intact.   IMPRESSION: A 74 year old gentleman with known Parkinson's disease, who presents with generalized weakness, found to have atrial fibrillation with mildly elevated heart rate of 115 bpm. Today, the patient's heart rate is better controlled, but still complains of generalized weakness. It is unclear whether the patient's weakness is associated with his atrial fibrillation since his heart rate is better controlled.   RECOMMENDATIONS:  1.  I agree with overall current therapy.  2.  Continue aspirin for stroke prevention.  3.  I agree with reinstating digoxin at a low-dose, carefully monitor levels.  4.  No further cardiac diagnostics at this time.  ____________________________ Isaias Cowman, MD ap:aw D: 02/15/2014 09:24:02 ET T: 02/15/2014 09:40:50 ET JOB#: [id number]  cc: Isaias Cowman, MD, <Dictator> Isaias Cowman MD ELECTRONICALLY SIGNED 03/08/2014 12:45

## 2018-08-20 ENCOUNTER — Emergency Department: Payer: Non-veteran care

## 2018-08-20 ENCOUNTER — Other Ambulatory Visit: Payer: Self-pay

## 2018-08-20 ENCOUNTER — Inpatient Hospital Stay
Admission: EM | Admit: 2018-08-20 | Discharge: 2018-08-23 | DRG: 871 | Disposition: A | Discharge status: Home / Self Care | Payer: Non-veteran care | Attending: Internal Medicine | Admitting: Internal Medicine

## 2018-08-20 DIAGNOSIS — Z993 Dependence on wheelchair: Secondary | ICD-10-CM | POA: Diagnosis not present

## 2018-08-20 DIAGNOSIS — G20 Parkinson's disease: Secondary | ICD-10-CM | POA: Diagnosis present

## 2018-08-20 DIAGNOSIS — E860 Dehydration: Secondary | ICD-10-CM | POA: Diagnosis present

## 2018-08-20 DIAGNOSIS — R1310 Dysphagia, unspecified: Secondary | ICD-10-CM | POA: Diagnosis present

## 2018-08-20 DIAGNOSIS — Z880 Allergy status to penicillin: Secondary | ICD-10-CM | POA: Diagnosis not present

## 2018-08-20 DIAGNOSIS — N179 Acute kidney failure, unspecified: Secondary | ICD-10-CM | POA: Diagnosis present

## 2018-08-20 DIAGNOSIS — A419 Sepsis, unspecified organism: Secondary | ICD-10-CM | POA: Diagnosis not present

## 2018-08-20 DIAGNOSIS — N390 Urinary tract infection, site not specified: Secondary | ICD-10-CM | POA: Diagnosis present

## 2018-08-20 DIAGNOSIS — Z7982 Long term (current) use of aspirin: Secondary | ICD-10-CM

## 2018-08-20 DIAGNOSIS — Z981 Arthrodesis status: Secondary | ICD-10-CM | POA: Diagnosis not present

## 2018-08-20 DIAGNOSIS — J181 Lobar pneumonia, unspecified organism: Secondary | ICD-10-CM

## 2018-08-20 DIAGNOSIS — Z66 Do not resuscitate: Secondary | ICD-10-CM | POA: Diagnosis present

## 2018-08-20 DIAGNOSIS — J189 Pneumonia, unspecified organism: Secondary | ICD-10-CM | POA: Diagnosis present

## 2018-08-20 DIAGNOSIS — I4891 Unspecified atrial fibrillation: Secondary | ICD-10-CM | POA: Diagnosis present

## 2018-08-20 DIAGNOSIS — I129 Hypertensive chronic kidney disease with stage 1 through stage 4 chronic kidney disease, or unspecified chronic kidney disease: Secondary | ICD-10-CM | POA: Diagnosis present

## 2018-08-20 DIAGNOSIS — F0280 Dementia in other diseases classified elsewhere without behavioral disturbance: Secondary | ICD-10-CM | POA: Diagnosis present

## 2018-08-20 DIAGNOSIS — K219 Gastro-esophageal reflux disease without esophagitis: Secondary | ICD-10-CM | POA: Diagnosis present

## 2018-08-20 DIAGNOSIS — N183 Chronic kidney disease, stage 3 (moderate): Secondary | ICD-10-CM | POA: Diagnosis present

## 2018-08-20 LAB — URINALYSIS, COMPLETE (UACMP) WITH MICROSCOPIC
BILIRUBIN URINE: NEGATIVE
Glucose, UA: NEGATIVE mg/dL
Ketones, ur: NEGATIVE mg/dL
Leukocytes, UA: NEGATIVE
Nitrite: NEGATIVE
Protein, ur: 100 mg/dL — AB
SPECIFIC GRAVITY, URINE: 1.015 (ref 1.005–1.030)
pH: 5 (ref 5.0–8.0)

## 2018-08-20 LAB — CBC WITH DIFFERENTIAL/PLATELET
ABS IMMATURE GRANULOCYTES: 0.08 10*3/uL — AB (ref 0.00–0.07)
Basophils Absolute: 0 10*3/uL (ref 0.0–0.1)
Basophils Relative: 0 %
EOS ABS: 0.1 10*3/uL (ref 0.0–0.5)
EOS PCT: 1 %
HEMATOCRIT: 34.4 % — AB (ref 39.0–52.0)
HEMOGLOBIN: 10.9 g/dL — AB (ref 13.0–17.0)
Immature Granulocytes: 1 %
Lymphocytes Relative: 7 %
Lymphs Abs: 0.9 10*3/uL (ref 0.7–4.0)
MCH: 28 pg (ref 26.0–34.0)
MCHC: 31.7 g/dL (ref 30.0–36.0)
MCV: 88.4 fL (ref 80.0–100.0)
MONO ABS: 0.7 10*3/uL (ref 0.1–1.0)
Monocytes Relative: 6 %
Neutro Abs: 11 10*3/uL — ABNORMAL HIGH (ref 1.7–7.7)
Neutrophils Relative %: 85 %
Platelets: 157 10*3/uL (ref 150–400)
RBC: 3.89 MIL/uL — AB (ref 4.22–5.81)
RDW: 13.4 % (ref 11.5–15.5)
WBC: 12.8 10*3/uL — AB (ref 4.0–10.5)
nRBC: 0 % (ref 0.0–0.2)

## 2018-08-20 LAB — COMPREHENSIVE METABOLIC PANEL
ALK PHOS: 61 U/L (ref 38–126)
ALT: 7 U/L (ref 0–44)
ANION GAP: 10 (ref 5–15)
AST: 22 U/L (ref 15–41)
Albumin: 3.4 g/dL — ABNORMAL LOW (ref 3.5–5.0)
BUN: 53 mg/dL — ABNORMAL HIGH (ref 8–23)
CALCIUM: 9.2 mg/dL (ref 8.9–10.3)
CO2: 26 mmol/L (ref 22–32)
Chloride: 107 mmol/L (ref 98–111)
Creatinine, Ser: 2.9 mg/dL — ABNORMAL HIGH (ref 0.61–1.24)
GFR, EST AFRICAN AMERICAN: 23 mL/min — AB (ref >60)
GFR, EST NON AFRICAN AMERICAN: 20 mL/min — AB (ref >60)
Glucose, Bld: 132 mg/dL — ABNORMAL HIGH (ref 70–99)
Potassium: 4 mmol/L (ref 3.5–5.1)
SODIUM: 143 mmol/L (ref 135–145)
Total Bilirubin: 0.8 mg/dL (ref 0.3–1.2)
Total Protein: 7.2 g/dL (ref 6.5–8.1)

## 2018-08-20 LAB — LACTIC ACID, PLASMA
LACTIC ACID, VENOUS: 2.4 mmol/L — AB (ref 0.5–1.9)
Lactic Acid, Venous: 1.9 mmol/L (ref 0.5–1.9)

## 2018-08-20 LAB — TROPONIN I: Troponin I: 0.03 ng/mL (ref <0.03)

## 2018-08-20 MED ORDER — POLYETHYLENE GLYCOL 3350 17 G PO PACK: 17.0000 g | PACK | Freq: Every day | ORAL | Status: DC | PRN

## 2018-08-20 MED ORDER — SODIUM CHLORIDE 0.9 % IV BOLUS
1000.0000 mL | Freq: Once | INTRAVENOUS | Status: AC
  Administered 2018-08-20: 1000 mL via INTRAVENOUS

## 2018-08-20 MED ORDER — ONDANSETRON HCL 4 MG/2ML IJ SOLN: 4.0000 mg | Freq: Four times a day (QID) | INTRAMUSCULAR | Status: DC | PRN

## 2018-08-20 MED ORDER — TRAMADOL HCL 50 MG PO TABS
50.0000 mg | ORAL_TABLET | Freq: Four times a day (QID) | ORAL | Status: DC | PRN
  Filled 2018-08-20: qty 1

## 2018-08-20 MED ORDER — LEVOFLOXACIN IN D5W 750 MG/150ML IV SOLN
750.0000 mg | INTRAVENOUS | Status: DC
  Administered 2018-08-22: 750 mg via INTRAVENOUS
  Filled 2018-08-20 (×2): qty 150

## 2018-08-20 MED ORDER — LEVOFLOXACIN IN D5W 750 MG/150ML IV SOLN
750.0000 mg | Freq: Once | INTRAVENOUS | Status: AC
  Administered 2018-08-20: 750 mg via INTRAVENOUS
  Filled 2018-08-20: qty 150

## 2018-08-20 MED ORDER — VANCOMYCIN HCL IN DEXTROSE 1-5 GM/200ML-% IV SOLN
1000.0000 mg | Freq: Once | INTRAVENOUS | Status: AC
  Administered 2018-08-20: 1000 mg via INTRAVENOUS
  Filled 2018-08-20: qty 200

## 2018-08-20 MED ORDER — ALBUTEROL SULFATE (2.5 MG/3ML) 0.083% IN NEBU: 2.5000 mg | INHALATION_SOLUTION | RESPIRATORY_TRACT | Status: DC | PRN

## 2018-08-20 MED ORDER — ACETAMINOPHEN 650 MG RE SUPP: 650.0000 mg | Freq: Four times a day (QID) | RECTAL | Status: DC | PRN

## 2018-08-20 MED ORDER — ACETAMINOPHEN 325 MG PO TABS: 650.0000 mg | ORAL_TABLET | Freq: Four times a day (QID) | ORAL | Status: DC | PRN

## 2018-08-20 MED ORDER — SODIUM CHLORIDE 0.9 % IV SOLN
INTRAVENOUS | Status: DC
  Administered 2018-08-20 – 2018-08-22 (×3): via INTRAVENOUS

## 2018-08-20 MED ORDER — HEPARIN SODIUM (PORCINE) 5000 UNIT/ML IJ SOLN
5000.0000 [IU] | Freq: Three times a day (TID) | INTRAMUSCULAR | Status: DC
  Administered 2018-08-20 – 2018-08-23 (×7): 5000 [IU] via SUBCUTANEOUS
  Filled 2018-08-20 (×8): qty 1

## 2018-08-20 MED ORDER — ONDANSETRON HCL 4 MG PO TABS: 4.0000 mg | ORAL_TABLET | Freq: Four times a day (QID) | ORAL | Status: DC | PRN

## 2018-08-20 NOTE — ED Triage Notes (Signed)
Pt brought in by EMS for AMS per family. Family states pt has UTI and has been taking Cipro for it. Wife states pt had "cold" Sunday and a fever Monday. Wife states she noticed pt was acting different since Monday. Hist of parkinsons. EMS states BG 201; Temp 99.8; and BP 111/64. Temp 97.9 axillary.

## 2018-08-20 NOTE — ED Notes (Signed)
Dr Reita Cliche notified in person of Lactic 2.4. Orders: 1L bolus NS.

## 2018-08-20 NOTE — ED Notes (Signed)
Pt transported to room 129 

## 2018-08-20 NOTE — ED Notes (Signed)
NOTIFIED  ANGELA  RN (CARE MANAGEMENT) THAT PT  WAS TO  CRITICAL FOR  TRANSFER PER DR  Reita Cliche MD

## 2018-08-20 NOTE — Consult Note (Signed)
Pharmacy Antibiotic Note  Ian Estrada is a 77 y.o. male admitted on 08/20/2018 with pneumonia and UTI.  Pharmacy has been consulted for Levaquin dosing. He was prescribed oral ciprofloxacin PTA but unable to take oral medications. CXR shows right lung PNA. Serum creatinine is elevated above baseline of approximately 1.3.  Plan: Levofloxacin 750mg  IV every 48 hours  Pharmacy will monitor for any improvements in renal function and adjust if needed.  Height: 6\' 2"  (188 cm) Weight: 153 lb 3.2 oz (69.5 kg) IBW/kg (Calculated) : 82.2  Temp (24hrs), Avg:97.9 F (36.6 C), Min:97.9 F (36.6 C), Max:97.9 F (36.6 C)  Recent Labs  Lab 08/20/18 1007 08/20/18 1025 08/20/18 1237  WBC  --  12.8*  --   CREATININE  --  2.90*  --   LATICACIDVEN 2.4*  --  1.9    Estimated Creatinine Clearance: 21.6 mL/min (A) (by C-G formula based on SCr of 2.9 mg/dL (H)).    Allergies  Allergen Reactions  . Penicillins Other (See Comments)    Unknown reaction per wife-Childhood reaction    Antimicrobials this admission: Vancomycin  10/17 x1 Levofloxacin  10/17 >>   Microbiology results: 10/17 BCx: pending 10/17 UCx: pending   Thank you for allowing pharmacy to be a part of this patient's care.  Dallie Piles, PharmD 08/20/2018 2:50 PM

## 2018-08-20 NOTE — Progress Notes (Signed)
Family Meeting Note  Advance Directive:yes Today a meeting took place with the wife in ER  Patient has advanced Parkinson's disease with dementia. He is bedbound wheelchair-bound. Wife with caregiver and family members help around. Comes in with sepsis secondary to right-sided pneumonia along with UTI and acute renal failure. Overall prognosis poor. Consider palliative care if continues to decline. This was discussed with patient's wife who can consider it as outpatient as well.  Carries out of facility DNR form. This was confirmed with the wife patient is DNR.  Time spent during discussion: Hewitt, MD

## 2018-08-20 NOTE — ED Provider Notes (Signed)
[facility] Emergency Department Provider Note ____________________________________________   I have reviewed the triage vital signs and the triage nursing note.  HISTORY  Chief Complaint Altered Mental Status and Fever   Historian Level 5 Caveat History Limited by Parkinson's disease, cognitive impairment Wife provides history  HPI Ian Estrada is a 77 y.o. male brought in by EMS for disorientation and decreased level of consciousness.  Sound like this started 1 to 2 days ago.  Yesterday patient was started on Cipro for what sound like possible urinary tract infection.  Wife notes that for a day or 2 he has had some disorientation and confusion, not at his baseline mental status, similar to prior UTI.  He is had some mild respiratory congestion.  Yesterday had a fever.  Patient was unable to take the Cipro because he was unable to cooperate in taking by p.o.  Does not sound like he is complaining of any pain or nausea vomiting or diarrhea.     Past Medical History:  Diagnosis Date  . Arthritis   . Cancer ([place])    skin - removal  . Dysrhythmia    afib  . GERD (gastroesophageal reflux disease)   . Mild cognitive impairment with memory loss    short term memory d/t parkinson's disease.  . Parkinson disease ([place])   . Unsteady gait   . Urinary frequency     Patient Active Problem List   Diagnosis Date Noted  . Unspecified vitamin D deficiency 07/19/2013  . Restless leg syndrome 07/19/2013  . Leukocytosis, unspecified 07/19/2013  . Spinal stenosis of lumbar region 07/19/2013  . Orthostatic hypotension 07/19/2013  . GERD (gastroesophageal reflux disease)   . Unsteady gait   . Parkinson disease [facility])     Past Surgical History:  Procedure Laterality Date  . HERNIA REPAIR    . MAXIMUM ACCESS (MAS)POSTERIOR LUMBAR INTERBODY FUSION (PLIF) 2 LEVEL N/A 07/09/2013   Procedure: FOR MAXIMUM ACCESS (MAS) POSTERIOR LUMBAR INTERBODY FUSION (PLIF) 2  LEVEL LUMBAR THREE-FOUR,LUMBAR FOUR-FIVE;  Surgeon: Eustace Moore, MD;  Location: [facility];  Service: Neurosurgery;  Laterality: N/A;  FOR MAXIMUM ACCESS (MAS) POSTERIOR LUMBAR INTERBODY FUSION (PLIF) 2 LEVEL  . TONSILLECTOMY      Prior to Admission medications   Medication Sig Start Date End Date Taking? Authorizing Provider  aspirin EC 81 MG tablet Take 81 mg by mouth every morning.    [provider]  B Complex Vitamins (B COMPLEX PO) Take 1 tablet by mouth daily.     [provider]  carbidopa-levodopa (SINEMET IR) 25-100 MG per tablet Take 1-1.5 tablets by mouth 3 (three) times daily. Takes 1.5 tab twice daily & 1 tab with dinner    [provider]  Cholecalciferol (VITAMIN D PO) Take 1 tablet by mouth daily.     [provider]  donepezil (ARICEPT) 10 MG tablet Take 10 mg by mouth daily with supper.     [provider]  FLUDROCORTISONE ACETATE PO Take 0.05 mg by mouth daily. Fludrocortisone Acetate 0.1mg - take 0.05mg  (1/2) tab daily)    [provider]  omeprazole (PRILOSEC) 20 MG capsule Take 20 mg by mouth daily.    [provider]  rOPINIRole (REQUIP) 0.5 MG tablet Take 0.5 mg by mouth daily with supper.     [provider]    Allergies  Allergen Reactions  . Penicillins Other (See Comments)    Unknown reaction per wife-Childhood reaction    History reviewed. No  pertinent family history.  Social History Social History   Tobacco Use  . Smoking status: Never Smoker  . Smokeless tobacco: Never Used  Substance Use Topics  . Alcohol use: No  . Drug use: No    Review of Systems  Constitutional: Positive for fever. Eyes: Negative for visual changes. ENT: Negative for sore throat. Cardiovascular: Negative for chest pain. Respiratory: Negative for shortness of breath.  For cough. Gastrointestinal: Negative for abdominal pain, vomiting and diarrhea. Genitourinary: Negative for  dysuria. Musculoskeletal: Negative for back pain. Skin: Negative for rash. Neurological: Negative for headache.  ____________________________________________   PHYSICAL EXAM:  VITAL SIGNS: ED Triage Vitals  Enc Vitals Group     BP      Pulse      Resp      Temp      Temp src      SpO2      Weight      Height      Head Circumference      Peak Flow      Pain Score      Pain Loc      Pain Edu?      Excl. in [place]?      Constitutional: Alert, but not verbal, not following commands. HEENT      Head: Normocephalic and atraumatic.      Eyes: Conjunctivae are normal. Pupils equal and round.       Ears:         Nose: No congestion/rhinnorhea.      Mouth/Throat: Mucous membranes are moderately dry.      Neck: No stridor. Cardiovascular/Chest: Normal rate, regular rhythm.  No murmurs, rubs, or gallops. Respiratory: Normal respiratory effort without tachypnea nor retractions. Breath sounds are clear and equal bilaterally. No wheezes/rales/rhonchi. Gastrointestinal: Soft. No distention, no guarding, no rebound. Nontender.    Genitourinary/rectal:Deferred Musculoskeletal: Nontender with normal range of motion in all extremities. No joint effusions.  No lower extremity tenderness.  No edema. Neurologic: No facial droop.  Nonverbal.  Normal speech and language.  Moves 4 extremities, but does seem to have some contractures. Skin:  Skin is warm, dry and intact. No rash noted. Psychiatric: No agitation..   ____________________________________________  LABS (pertinent positives/negatives) I, Lisa Roca, MD the attending physician have reviewed the labs noted below.  Labs Reviewed  URINALYSIS, COMPLETE (UACMP) WITH MICROSCOPIC - Abnormal; Notable for the following components:      Result Value   Color, Urine YELLOW (*)    APPearance CLOUDY (*)    Hgb urine dipstick MODERATE (*)    Protein, ur 100 (*)    Bacteria, UA FEW (*)    All other components within normal limits   COMPREHENSIVE METABOLIC PANEL - Abnormal; Notable for the following components:   Glucose, Bld 132 (*)    BUN 53 (*)    Creatinine, Ser 2.90 (*)    Albumin 3.4 (*)    GFR calc non Af Amer 20 (*)    GFR calc Af Amer 23 (*)    All other components within normal limits  TROPONIN I - Abnormal; Notable for the following components:   Troponin I 0.03 (*)    All other components within normal limits  LACTIC ACID, PLASMA - Abnormal; Notable for the following components:   Lactic Acid, Venous 2.4 (*)    All other components within normal limits  CBC WITH DIFFERENTIAL/PLATELET - Abnormal; Notable for the following components:   WBC 12.8 (*)    RBC  3.89 (*)    Hemoglobin 10.9 (*)    HCT 34.4 (*)    Neutro Abs 11.0 (*)    Abs Immature Granulocytes 0.08 (*)    All other components within normal limits  URINE CULTURE  CULTURE, BLOOD (ROUTINE X 2)  CULTURE, BLOOD (ROUTINE X 2)  LACTIC ACID, PLASMA    ____________________________________________    EKG I, Lisa Roca, MD, the attending physician have personally viewed and interpreted all ECGs.  68 bpm.  Normal sinus rhythm with occasional PVC.  Narrow QRS.  Normal axis.  Nonspecific T wave ____________________________________________  RADIOLOGY   Cxr:  IMPRESSION: Right mid and lower lung pneumonia. Followup PA and lateral chest X-ray is recommended in 3-4 weeks following trial of antibiotic therapy to ensure resolution and exclude underlying malignancy. __________________________________________  PROCEDURES  Procedure(s) performed: None  Procedures  Critical Care performed: CRITICAL CARE Performed by: Lisa Roca   Total critical care time: 30 minutes  Critical care time was exclusive of separately billable procedures and treating other patients.  Critical care was necessary to treat or prevent imminent or life-threatening deterioration.  Critical care was time spent personally by me on the following activities:  development of treatment plan with patient and/or surrogate as well as nursing, discussions with consultants, evaluation of patient's response to treatment, examination of patient, obtaining history from patient or surrogate, ordering and performing treatments and interventions, ordering and review of laboratory studies, ordering and review of radiographic studies, pulse oximetry and re-evaluation of patient's condition.    ____________________________________________  ED COURSE / ASSESSMENT AND PLAN  Pertinent labs & imaging results that were available during my care of the patient were reviewed by me and considered in my medical decision making (see chart for details).     Patient brought in by family for altered mental status and concern about possible clinical UTI. He also has a cough.  X-ray shows right-sided pneumonia.  He is not hypoxic.  He has no tachycardia or hypotension.  He has had a fever at home but is afebrile here.  Patient started on sepsis pathway, but awaiting laboratory studies.  Levaquin was initiated for antibiotic coverage for pneumonia as well as UTI seen on urinalysis.  Lactate slightly elevated above 2, patient started on second liter normal saline.  He is also found to have slightly elevated white blood cell count.  I am going to go ahead and broaden coverage to cover for possible sepsis given now the to locus for infection as well as elevated white blood cell count as well as elevated lactate.  Patient has VA benefits, however I think he is unstable for transfer for at this point in time given early in management for sepsis.   CONSULTATIONS: Hospitalist for admission.   Patient / Family / Caregiver informed of clinical course, medical decision-making process, and agree with plan.    ___________________________________________   FINAL CLINICAL IMPRESSION(S) / ED DIAGNOSES   Final diagnoses:  Pneumonia of right upper lobe due to infectious organism  [facility])  Urinary tract infection without hematuria, site unspecified  Sepsis, due to unspecified organism, unspecified whether acute organ dysfunction present ([place])  AKI (acute kidney injury) ([place])      ___________________________________________         Note: This dictation was prepared with Dragon dictation. Any transcriptional errors that result from this process are unintentional    Lisa Roca, MD 08/20/18 1304

## 2018-08-20 NOTE — H&P (Signed)
[place] at [place] NAME: Georgio Hattabaugh    MR#:  [id number]  DATE OF BIRTH:  [birth year]  DATE OF ADMISSION:  08/20/2018  PRIMARY CARE PHYSICIAN: Patient, No Pcp Per   REQUESTING/REFERRING PHYSICIAN: dr Reita Cliche  CHIEF COMPLAINT:  high-grade fever, not eating drinking well, urine foul-smelling  HISTORY OF PRESENT ILLNESS:  Fuad Forget  is a 77 y.o. male with a known history of working since disease with Parkinson's dementia, atrial fibrillation, history of UTI, comes from home accompanied by wife who takes care of him along with caregivers in the house with fever decreased responsiveness/l lethargy. In the ER patient was found to have lactic acid of 2.4 elevated white count and found to have right upper and middle lobe pneumonia. Patient has allergy to penicillin he received a dose of vancomycin and Levaquin  patient has history of chronic kidney disease. His baseline creatinine is around 1.3. Creatinine today's 2.9. Clinically appears dehydrated  He is being admitted with sepsis due to pneumonia and UTI along with acute renal failure  PAST MEDICAL HISTORY:   Past Medical History:  Diagnosis Date  . Arthritis   . Cancer ([place])    skin - removal  . Dysrhythmia    afib  . GERD (gastroesophageal reflux disease)   . Mild cognitive impairment with memory loss    short term memory d/t parkinson's disease.  . Parkinson disease ([place])   . Unsteady gait   . Urinary frequency     PAST SURGICAL HISTOIRY:   Past Surgical History:  Procedure Laterality Date  . HERNIA REPAIR    . MAXIMUM ACCESS (MAS)POSTERIOR LUMBAR INTERBODY FUSION (PLIF) 2 LEVEL N/A 07/09/2013   Procedure: FOR MAXIMUM ACCESS (MAS) POSTERIOR LUMBAR INTERBODY FUSION (PLIF) 2 LEVEL LUMBAR THREE-FOUR,LUMBAR FOUR-FIVE;  Surgeon: Eustace Moore, MD;  Location: [facility];  Service: Neurosurgery;  Laterality: N/A;  FOR MAXIMUM ACCESS (MAS) POSTERIOR LUMBAR INTERBODY FUSION  (PLIF) 2 LEVEL  . TONSILLECTOMY      SOCIAL HISTORY:   Social History   Tobacco Use  . Smoking status: Never Smoker  . Smokeless tobacco: Never Used  Substance Use Topics  . Alcohol use: No    FAMILY HISTORY:  History reviewed. No pertinent family history.  DRUG ALLERGIES:   Allergies  Allergen Reactions  . Penicillins Other (See Comments)    Unknown reaction per wife-Childhood reaction    REVIEW OF SYSTEMS:  Review of Systems  Unable to perform ROS: Dementia     MEDICATIONS AT HOME:   Prior to Admission medications   Medication Sig Start Date End Date Taking? Authorizing Provider  aspirin EC 81 MG tablet Take 81 mg by mouth every morning.   Yes [provider]  carbidopa-levodopa (SINEMET IR) 25-100 MG per tablet Take 1 tablet by mouth 4 (four) times daily. Takes 1.5 tab twice daily & 1 tab with dinner   Yes [provider]  Cholecalciferol (VITAMIN D PO) Take 1,000 Units by mouth daily.    Yes [provider]  donepezil (ARICEPT) 10 MG tablet Take 10 mg by mouth daily with supper.    Yes [provider]  Melatonin 3 MG TABS Take 3 mg by mouth at bedtime.   Yes [provider]  B Complex Vitamins (B COMPLEX PO) Take 1 tablet by mouth daily.     [provider]  FLUDROCORTISONE ACETATE PO Take 0.05 mg by mouth daily. Fludrocortisone Acetate 0.1mg - take 0.05mg  (1/2) tab  daily)    [provider]  omeprazole (PRILOSEC) 20 MG capsule Take 20 mg by mouth daily.    [provider]  rOPINIRole (REQUIP) 0.5 MG tablet Take 0.5 mg by mouth daily with supper.     [provider]      VITAL SIGNS:  Blood pressure (!) 141/86, pulse 65, temperature 97.9 F (36.6 C), temperature source Axillary, resp. rate 15, height 6\' 2"  (1.88 m), weight 69.5 kg, SpO2 97 %.  PHYSICAL EXAMINATION:  GENERAL:  77 y.o.-year-old patient lying in the bed with no acute distress. Appears chronically ill EYES: Pupils  equal, round, reactive to light and accommodation. No scleral icterus. Extraocular muscles intact.  HEENT: Head atraumatic, normocephalic. Oropharynx and nasopharynx clear.  NECK:  Supple, no jugular venous distention. No thyroid enlargement, no tenderness.  LUNGS: decreased breath sounds bilaterally, no wheezing, rales,rhonchi or crepitation. No use of accessory muscles of respiration. No Labored breathing CARDIOVASCULAR: S1, S2 normal. No murmurs, rubs, or gallops.  ABDOMEN: Soft, nontender, nondistended. Bowel sounds present. No organomegaly or mass.  EXTREMITIES: No pedal edema, cyanosis, or clubbing.  NEUROLOGIC: able to check patient has dementia  PSYCHIATRIC: The patient is alert  SKIN: No obvious rash, lesion, or ulcer-- per wife  LABORATORY PANEL:   CBC Recent Labs  Lab 08/20/18 1025  WBC 12.8*  HGB 10.9*  HCT 34.4*  PLT 157   ------------------------------------------------------------------------------------------------------------------  Chemistries  Recent Labs  Lab 08/20/18 1025  NA 143  K 4.0  CL 107  CO2 26  GLUCOSE 132*  BUN 53*  CREATININE 2.90*  CALCIUM 9.2  AST 22  ALT 7  ALKPHOS 61  BILITOT 0.8   ------------------------------------------------------------------------------------------------------------------  Cardiac Enzymes Recent Labs  Lab 08/20/18 1025  TROPONINI 0.03*   ------------------------------------------------------------------------------------------------------------------  RADIOLOGY:  Dg Chest Port 1 View  Result Date: 08/20/2018 CLINICAL DATA:  Fever, cough EXAM: PORTABLE CHEST 1 VIEW COMPARISON:  04/20/2014 FINDINGS: Areas of consolidation in the mid and lower right lung compatible with pneumonia. Left lung clear. Heart is borderline in size. No visible effusions. No acute bony abnormality. IMPRESSION: Right mid and lower lung pneumonia. Followup PA and lateral chest X-ray is recommended in 3-4 weeks following trial of  antibiotic therapy to ensure resolution and exclude underlying malignancy. Electronically Signed   By: Rolm Baptise M.D.   On: 08/20/2018 10:23    EKG:    IMPRESSION AND PLAN:   Garold Sheeler  is a 77 y.o. male with a known history of working since disease with Parkinson's dementia, atrial fibrillation, history of UTI, comes from home accompanied by wife who takes care of him along with caregivers in the house with fever decreased responsiveness/l lethargy. In the ER patient was found to have lactic acid of 2.4 elevated white count and found to have right upper and middle lobe pneumonia.  1. sepsis due to right middle lobe upper lobe pneumonia and UTI -came in with high-grade fever, tachycardia, elevated white count, lactic acid, x-ray positive for pneumonia -continue IV Levaquin renal dosing -continue IV fluids -repeat lactic normal -patient more awake -follow blood culture urine culture  2. acute on chronic renal failure secondary dehydration from poor PO intake -IV fluids -monitor input output -came in with creatinine of 2.9--- IV fluids--bmp  3. Parkinson's disease with dementia -patient has care giver at home along with wife who helps care of him -he does not ambulate. He is wheelchair-bound and bedbound -has some issues with dysphagia. Will get speech therapy consultation  4.  DVT prophylaxis subcu heparin    All the records are reviewed and case discussed with ED provider. Management plans discussed with the patient, family and they are in agreement.  CODE STATUS: DNR  TOTAL TIME TAKING CARE OF THIS PATIENT: *50* minutes.    Fritzi Mandes M.D on 08/20/2018 at 2:52 PM  Between 7am to 6pm - Pager - [phone]  After 6pm go to [email or web address] - password [email or web address] [facility]  Office  [phone]  CC: Primary care physician; Patient, No Pcp Per

## 2018-08-20 NOTE — ED Notes (Signed)
Dr Reita Cliche notified in person of Trop 0.03.

## 2018-08-20 NOTE — ED Notes (Signed)
ALL  PAPERWORK  FAXED  TO  [facility] .  SPOKE  WITH  TANYA TO LET  HER KNOW  PT  TO  CRITICAL  FOR  TRANSFER  PER  DR  REBECCA  LORD  MD .

## 2018-08-21 ENCOUNTER — Inpatient Hospital Stay: Payer: Non-veteran care

## 2018-08-21 LAB — BASIC METABOLIC PANEL
ANION GAP: 11 (ref 5–15)
BUN: 49 mg/dL — ABNORMAL HIGH (ref 8–23)
CALCIUM: 8.8 mg/dL — AB (ref 8.9–10.3)
CO2: 25 mmol/L (ref 22–32)
Chloride: 110 mmol/L (ref 98–111)
Creatinine, Ser: 2.39 mg/dL — ABNORMAL HIGH (ref 0.61–1.24)
GFR, EST AFRICAN AMERICAN: 29 mL/min — AB (ref >60)
GFR, EST NON AFRICAN AMERICAN: 25 mL/min — AB (ref >60)
Glucose, Bld: 92 mg/dL (ref 70–99)
Potassium: 3.7 mmol/L (ref 3.5–5.1)
Sodium: 146 mmol/L — ABNORMAL HIGH (ref 135–145)

## 2018-08-21 LAB — URINE CULTURE: CULTURE: NO GROWTH

## 2018-08-21 MED ORDER — FLUDROCORTISONE ACETATE 0.1 MG PO TABS
0.0500 mg | ORAL_TABLET | Freq: Every day | ORAL | Status: DC
  Administered 2018-08-21 – 2018-08-23 (×3): 0.05 mg via ORAL
  Filled 2018-08-21 (×3): qty 0.5

## 2018-08-21 MED ORDER — MELATONIN 5 MG PO TABS
5.0000 mg | ORAL_TABLET | Freq: Every day | ORAL | Status: DC
  Administered 2018-08-21 – 2018-08-22 (×2): 5 mg via ORAL
  Filled 2018-08-21 (×3): qty 1

## 2018-08-21 MED ORDER — PANTOPRAZOLE SODIUM 40 MG PO TBEC
40.0000 mg | DELAYED_RELEASE_TABLET | Freq: Every day | ORAL | Status: DC
  Administered 2018-08-21 – 2018-08-23 (×2): 40 mg via ORAL
  Filled 2018-08-21 (×3): qty 1

## 2018-08-21 MED ORDER — CARBIDOPA-LEVODOPA 25-100 MG PO TABS
1.0000 | ORAL_TABLET | Freq: Three times a day (TID) | ORAL | Status: DC
  Administered 2018-08-21 – 2018-08-23 (×6): 1 via ORAL
  Filled 2018-08-21 (×7): qty 1

## 2018-08-21 MED ORDER — RENA-VITE PO TABS
1.0000 | ORAL_TABLET | Freq: Every day | ORAL | Status: DC
  Administered 2018-08-21 – 2018-08-23 (×3): 1 via ORAL
  Filled 2018-08-21 (×3): qty 1

## 2018-08-21 MED ORDER — ASPIRIN EC 81 MG PO TBEC
81.0000 mg | DELAYED_RELEASE_TABLET | Freq: Every morning | ORAL | Status: DC
  Administered 2018-08-21 – 2018-08-23 (×3): 81 mg via ORAL
  Filled 2018-08-21 (×3): qty 1

## 2018-08-21 MED ORDER — ROPINIROLE HCL 1 MG PO TABS
0.5000 mg | ORAL_TABLET | Freq: Every day | ORAL | Status: DC
  Administered 2018-08-21 – 2018-08-22 (×2): 0.5 mg via ORAL
  Filled 2018-08-21 (×2): qty 1

## 2018-08-21 MED ORDER — VITAMIN D 1000 UNITS PO TABS
1000.0000 [IU] | ORAL_TABLET | Freq: Every day | ORAL | Status: DC
  Administered 2018-08-21 – 2018-08-23 (×3): 1000 [IU] via ORAL
  Filled 2018-08-21 (×3): qty 1

## 2018-08-21 MED ORDER — DONEPEZIL HCL 5 MG PO TABS
10.0000 mg | ORAL_TABLET | Freq: Every day | ORAL | Status: DC
  Administered 2018-08-21 – 2018-08-22 (×2): 10 mg via ORAL
  Filled 2018-08-21 (×3): qty 2

## 2018-08-21 NOTE — Care Management (Signed)
RNCM consulted on patient to assist with any transition of care needs. Patient currently lives with spouse [name] [phone]. [name] and personal care services are in the home to assist with patient care. [name] tells me the patient is total care but she has significant help. She also has a hospital bed, lift chair, wheelchair, etc all in the home and has no needs for further DME. [name] tells me patient has had home health in the past and the patient has not done well with it. [name] thinks patient is at his baseline and she has no hopes for improvement due to nature of the disease process. No RNCM needs.

## 2018-08-21 NOTE — Plan of Care (Signed)

## 2018-08-21 NOTE — Progress Notes (Signed)
[facility] at [place] NAME: Ian Estrada    MR#:  [id number]  DATE OF BIRTH:  May 03, 1941  SUBJECTIVE:  CHIEF COMPLAINT:   Chief Complaint  Patient presents with  . Altered Mental Status  . Fever   - close to baseline, soft speech - wife at bedside - cultures pending  REVIEW OF SYSTEMS:  Review of Systems  Unable to perform ROS: Patient nonverbal    DRUG ALLERGIES:   Allergies  Allergen Reactions  . Penicillins Other (See Comments)    Unknown reaction per wife-Childhood reaction    VITALS:  Blood pressure 121/83, pulse 71, temperature 97.8 F (36.6 C), temperature source Oral, resp. rate 20, height 6\' 2"  (1.88 m), weight 69.5 kg, SpO2 97 %.  PHYSICAL EXAMINATION:  Physical Exam  GENERAL:  77 y.o.-year-old patient lying in the bed with no acute distress.  EYES: Pupils equal, round, reactive to light and accommodation. No scleral icterus. Extraocular muscles intact.  HEENT: Head atraumatic, normocephalic. Oropharynx and nasopharynx clear.  NECK:  Supple, no jugular venous distention. No thyroid enlargement, no tenderness.  LUNGS: Normal breath sounds bilaterally, decreased right sided breath sounds. no wheezing, rales or crepitation. No use of accessory muscles of respiration.  CARDIOVASCULAR: S1, S2 normal. No rubs, or gallops. 2/6 systolic murmur ABDOMEN: Soft, nontender, nondistended. Bowel sounds present. No organomegaly or mass.  EXTREMITIES: No pedal edema, cyanosis, or clubbing.  NEUROLOGIC: Cranial nerves II through XII are intact.soft speech. Muscle strength 2/5 both lower legs chronic, upper arms 4/5. Sensation intact. Gait not checked.  PSYCHIATRIC: The patient is alert.  SKIN: No obvious rash, lesion, or ulcer.    LABORATORY PANEL:   CBC Recent Labs  Lab 08/20/18 1025  WBC 12.8*  HGB 10.9*  HCT 34.4*  PLT 157    ------------------------------------------------------------------------------------------------------------------  Chemistries  Recent Labs  Lab 08/20/18 1025 08/21/18 0535  NA 143 146*  K 4.0 3.7  CL 107 110  CO2 26 25  GLUCOSE 132* 92  BUN 53* 49*  CREATININE 2.90* 2.39*  CALCIUM 9.2 8.8*  AST 22  --   ALT 7  --   ALKPHOS 61  --   BILITOT 0.8  --    ------------------------------------------------------------------------------------------------------------------  Cardiac Enzymes Recent Labs  Lab 08/20/18 1025  TROPONINI 0.03*   ------------------------------------------------------------------------------------------------------------------  RADIOLOGY:  Dg Chest Port 1 View  Result Date: 08/20/2018 CLINICAL DATA:  Fever, cough EXAM: PORTABLE CHEST 1 VIEW COMPARISON:  04/20/2014 FINDINGS: Areas of consolidation in the mid and lower right lung compatible with pneumonia. Left lung clear. Heart is borderline in size. No visible effusions. No acute bony abnormality. IMPRESSION: Right mid and lower lung pneumonia. Followup PA and lateral chest X-ray is recommended in 3-4 weeks following trial of antibiotic therapy to ensure resolution and exclude underlying malignancy. Electronically Signed   By: Rolm Baptise M.D.   On: 08/20/2018 10:23    EKG:   Orders placed or performed during the hospital encounter of 08/20/18  . ED EKG  . ED EKG  . EKG 12-Lead  . EKG 12-Lead    ASSESSMENT AND PLAN:   77 y/o with PMH significant h/o parkinsons disease, dementia, A. fib who is wheelchair-bound at baseline presents to hospital secondary to worsening confusion/lethargy.  Noted to have pneumonia and UTI  1.  Sepsis-secondary to right-sided pneumonia and also urinary tract infection -Concern for aspiration due to right-sided pneumonia.  Appreciate speech consult. -Status post modified barium swallow study done.  Patient on a dysphagia diet with nectar thick at this time -Blood  cultures and urine cultures are pending -Continue Levaquin -IV fluids  2.  Acute on chronic renal failure-known history of CKD stage III with baseline creatinine of 1.3 -Continue IV fluids, monitor for nephrotoxins -Slowly improving creatinine.  3.  Parkinson's disease with dementia-seems to be close to baseline.  Mental status is improving.  Wheelchair-bound at baseline. -Restart his Sinemet, Aricept.  4.  GERD-on PPI  5.  DVT prophylaxis-Lovenox    All the records are reviewed and case discussed with Care Management/Social Workerr. Management plans discussed with the patient, family and they are in agreement.  CODE STATUS: DNR  TOTAL TIME TAKING CARE OF THIS PATIENT: 38 minutes.   POSSIBLE D/C IN 2 DAYS, DEPENDING ON CLINICAL CONDITION.   Gladstone Lighter M.D on 08/21/2018 at 3:41 PM  Between 7am to 6pm - Pager - [phone]  After 6pm go to [email or web address] - password [email or web address] [facility]  Office  [phone]  CC: Primary care physician; Patient, No Pcp Per

## 2018-08-21 NOTE — Evaluation (Signed)
Objective Swallowing Evaluation: Type of Study: MBS-Modified Barium Swallow Study   Patient Details  Name: Ian Estrada MRN: [id number] Date of Birth: [birth year]  Today's Date: 08/21/2018 Time: SLP Start Time (ACUTE ONLY): 1000 -SLP Stop Time (ACUTE ONLY): 1100  SLP Time Calculation (min) (ACUTE ONLY): 60 min   Past Medical History:  Past Medical History:  Diagnosis Date  . Arthritis   . Cancer ([place])    skin - removal  . Dysrhythmia    afib  . GERD (gastroesophageal reflux disease)   . Mild cognitive impairment with memory loss    short term memory d/t parkinson's disease.  . Parkinson disease ([place])   . Unsteady gait   . Urinary frequency    Past Surgical History:  Past Surgical History:  Procedure Laterality Date  . HERNIA REPAIR    . MAXIMUM ACCESS (MAS)POSTERIOR LUMBAR INTERBODY FUSION (PLIF) 2 LEVEL N/A 07/09/2013   Procedure: FOR MAXIMUM ACCESS (MAS) POSTERIOR LUMBAR INTERBODY FUSION (PLIF) 2 LEVEL LUMBAR THREE-FOUR,LUMBAR FOUR-FIVE;  Surgeon: Eustace Moore, MD;  Location: [facility];  Service: Neurosurgery;  Laterality: N/A;  FOR MAXIMUM ACCESS (MAS) POSTERIOR LUMBAR INTERBODY FUSION (PLIF) 2 LEVEL  . TONSILLECTOMY     HPI: H&P:  Ian Estrada  is a 77 y.o. male with a known history of working since disease with Parkinson's dementia, atrial fibrillation, history of UTI, comes from home accompanied by wife who takes care of him along with caregivers in the house with fever decreased responsiveness/l lethargy. In the ER patient was found to have lactic acid of 2.4 elevated white count and found to have right upper and middle lobe pneumonia.  Patient has allergy to penicillin he received a dose of vancomycin and Levaquin patient has history of chronic kidney disease. His baseline creatinine is around 1.3. Creatinine today's 2.9. Clinically appears dehydrated  He is being admitted with sepsis due to pneumonia and UTI along with acute renal failure.  Chart review show no known  swallowing assessment with SLP in the past.   Subjective: the patient is confused but verbally responsive    Assessment / Plan / Recommendation  CHL IP CLINICAL IMPRESSIONS 08/21/2018  Clinical Impression The patient is presenting with severe risk for aspiration of thin liquid and mild risk for aspiration of thicker consistencies (nectar-thick liquid and pureed solid).  The patient is presenting with moderate oropharyngeal dysphagia characterized by disorganized / prolonged oral management, piecemeal swallowing, delayed pharyngeal swallow initiation, trace-to-mild vallecular residue, transient/flash laryngeal penetration with nectar-thick liquids, and greater than trace silent aspiration of thin liquid via self-administered cup rim.  Recommend dysphagia 1 diet with nectar-thick liquids, following strict aspiration precautions.  SLP Visit Diagnosis Dysphagia, oropharyngeal phase (R13.12)  Attention and concentration deficit following --  Frontal lobe and executive function deficit following --  Impact on safety and function Mild aspiration risk;Other (comment);Risk for inadequate nutrition/hydration      CHL IP TREATMENT RECOMMENDATION 08/21/2018  Treatment Recommendations Therapy as outlined in treatment plan below     Prognosis 08/21/2018  Prognosis for Safe Diet Advancement Fair  Barriers to Reach Goals Severity of deficits  Barriers/Prognosis Comment --    CHL IP DIET RECOMMENDATION 08/21/2018  SLP Diet Recommendations Dysphagia 1 (Puree) solids;Nectar thick liquid  Liquid Administration via Cup;No straw  Medication Administration Crushed with puree  Compensations Minimize environmental distractions;Slow rate;Small sips/bites;Monitor for anterior loss  Postural Changes Seated upright at 90 degrees      CHL IP OTHER RECOMMENDATIONS 08/21/2018  Recommended Consults --  Oral Care Recommendations Oral care QID  Other Recommendations --      CHL IP FOLLOW UP RECOMMENDATIONS  08/21/2018  Follow up Recommendations Home health SLP      CHL IP FREQUENCY AND DURATION 08/21/2018  Speech Therapy Frequency (ACUTE ONLY) min 3x week  Treatment Duration 2 weeks           CHL IP ORAL PHASE 08/21/2018  Oral Phase Impaired  Oral - Pudding Teaspoon Lingual pumping;Weak lingual manipulation;Other (Comment)  Oral - Pudding Cup --  Oral - Honey Teaspoon --  Oral - Honey Cup --  Oral - Nectar Teaspoon Lingual pumping;Weak lingual manipulation;Premature spillage  Oral - Nectar Cup Lingual pumping;Weak lingual manipulation;Premature spillage  Oral - Nectar Straw --  Oral - Thin Teaspoon Weak lingual manipulation;Impaired mastication;Premature spillage  Oral - Thin Cup Left anterior bolus loss;Right anterior bolus loss;Weak lingual manipulation;Lingual pumping;Premature spillage  Oral - Thin Straw --  Oral - Puree --  Oral - Mech Soft --  Oral - Regular --  Oral - Multi-Consistency --  Oral - Pill --  Oral Phase - Comment --    CHL IP PHARYNGEAL PHASE 08/21/2018  Pharyngeal Phase Impaired  Pharyngeal- Pudding Teaspoon Delayed swallow initiation-vallecula;Reduced tongue base retraction;Pharyngeal residue - valleculae  Pharyngeal --  Pharyngeal- Pudding Cup --  Pharyngeal --  Pharyngeal- Honey Teaspoon --  Pharyngeal --  Pharyngeal- Honey Cup --  Pharyngeal --  Pharyngeal- Nectar Teaspoon Delayed swallow initiation-vallecula;Delayed swallow initiation-pyriform sinuses;Reduced tongue base retraction;Pharyngeal residue - valleculae;Pharyngeal residue - pyriform  Pharyngeal --  Pharyngeal- Nectar Cup Delayed swallow initiation-vallecula;Delayed swallow initiation-pyriform sinuses;Reduced airway/laryngeal closure;Reduced tongue base retraction;Penetration/Aspiration before swallow  Pharyngeal --  Pharyngeal- Nectar Straw --  Pharyngeal --  Pharyngeal- Thin Teaspoon Delayed swallow initiation-vallecula;Delayed swallow initiation-pyriform sinuses;Reduced tongue base  retraction;Pharyngeal residue - valleculae;Pharyngeal residue - pyriform;Pharyngeal residue - posterior pharnyx  Pharyngeal --  Pharyngeal- Thin Cup Delayed swallow initiation-vallecula;Delayed swallow initiation-pyriform sinuses;Reduced tongue base retraction;Penetration/Aspiration before swallow;Moderate aspiration;Other (Comment)  Pharyngeal --  Pharyngeal- Thin Straw --  Pharyngeal --  Pharyngeal- Puree --  Pharyngeal --  Pharyngeal- Mechanical Soft --  Pharyngeal --  Pharyngeal- Regular --  Pharyngeal --  Pharyngeal- Multi-consistency --  Pharyngeal --  Pharyngeal- Pill --  Pharyngeal --  Pharyngeal Comment --     No flowsheet data found.  Leroy Sea, MS/CCC- SLP  Lou Miner 08/21/2018, 11:32 AM

## 2018-08-22 LAB — BASIC METABOLIC PANEL
Anion gap: 7 (ref 5–15)
BUN: 47 mg/dL — ABNORMAL HIGH (ref 8–23)
CHLORIDE: 116 mmol/L — AB (ref 98–111)
CO2: 25 mmol/L (ref 22–32)
Calcium: 8.5 mg/dL — ABNORMAL LOW (ref 8.9–10.3)
Creatinine, Ser: 2.06 mg/dL — ABNORMAL HIGH (ref 0.61–1.24)
GFR calc Af Amer: 35 mL/min — ABNORMAL LOW (ref >60)
GFR calc non Af Amer: 30 mL/min — ABNORMAL LOW (ref >60)
Glucose, Bld: 106 mg/dL — ABNORMAL HIGH (ref 70–99)
Potassium: 3.5 mmol/L (ref 3.5–5.1)
SODIUM: 148 mmol/L — AB (ref 135–145)

## 2018-08-22 MED ORDER — METOPROLOL TARTRATE 5 MG/5ML IV SOLN
5.0000 mg | INTRAVENOUS | Status: DC | PRN
  Administered 2018-08-22: 21:00:00 5 mg via INTRAVENOUS
  Filled 2018-08-22: qty 5

## 2018-08-22 MED ORDER — SODIUM CHLORIDE 0.45 % IV SOLN
INTRAVENOUS | Status: DC
  Administered 2018-08-22 – 2018-08-23 (×2): via INTRAVENOUS

## 2018-08-22 MED ORDER — AMLODIPINE BESYLATE 5 MG PO TABS
5.0000 mg | ORAL_TABLET | Freq: Every day | ORAL | Status: DC
  Administered 2018-08-22 – 2018-08-23 (×2): 5 mg via ORAL
  Filled 2018-08-22 (×2): qty 1

## 2018-08-22 NOTE — Progress Notes (Signed)
[place] at [place] NAME: Ian Estrada    MR#:  [id number]  DATE OF BIRTH:  12-Aug-1941  SUBJECTIVE:  CHIEF COMPLAINT:   Chief Complaint  Patient presents with  . Altered Mental Status  . Fever   - doing better, coughing more per wife - on room air  REVIEW OF SYSTEMS:  Review of Systems  Unable to perform ROS: Patient nonverbal    DRUG ALLERGIES:   Allergies  Allergen Reactions  . Penicillins Other (See Comments)    Unknown reaction per wife-Childhood reaction    VITALS:  Blood pressure (!) 160/87, pulse 68, temperature 98 F (36.7 C), temperature source Oral, resp. rate 20, height 6\' 2"  (1.88 m), weight 69.5 kg, SpO2 97 %.  PHYSICAL EXAMINATION:  Physical Exam  GENERAL:  77 y.o.-year-old patient lying in the bed with no acute distress.  EYES: Pupils equal, round, reactive to light and accommodation. No scleral icterus. Extraocular muscles intact.  HEENT: Head atraumatic, normocephalic. Oropharynx and nasopharynx clear.  NECK:  Supple, no jugular venous distention. No thyroid enlargement, no tenderness.  LUNGS: Normal breath sounds bilaterally, decreased right sided breath sounds. no wheezing, rales or crepitation. No use of accessory muscles of respiration.  CARDIOVASCULAR: S1, S2 normal. No rubs, or gallops. 2/6 systolic murmur ABDOMEN: Soft, nontender, nondistended. Bowel sounds present. No organomegaly or mass.  EXTREMITIES: No pedal edema, cyanosis, or clubbing.  NEUROLOGIC: Cranial nerves II through XII are intact.soft speech. Muscle strength 2/5 both lower legs chronic, upper arms 4/5. Sensation intact. Gait not checked.  PSYCHIATRIC: The patient is alert.  SKIN: No obvious rash, lesion, or ulcer.    LABORATORY PANEL:   CBC Recent Labs  Lab 08/20/18 1025  WBC 12.8*  HGB 10.9*  HCT 34.4*  PLT 157    ------------------------------------------------------------------------------------------------------------------  Chemistries  Recent Labs  Lab 08/20/18 1025  08/22/18 0354  NA 143   < > 148*  K 4.0   < > 3.5  CL 107   < > 116*  CO2 26   < > 25  GLUCOSE 132*   < > 106*  BUN 53*   < > 47*  CREATININE 2.90*   < > 2.06*  CALCIUM 9.2   < > 8.5*  AST 22  --   --   ALT 7  --   --   ALKPHOS 61  --   --   BILITOT 0.8  --   --    < > = values in this interval not displayed.   ------------------------------------------------------------------------------------------------------------------  Cardiac Enzymes Recent Labs  Lab 08/20/18 1025  TROPONINI 0.03*   ------------------------------------------------------------------------------------------------------------------  RADIOLOGY:  No results found.  EKG:   Orders placed or performed during the hospital encounter of 08/20/18  . ED EKG  . ED EKG  . EKG 12-Lead  . EKG 12-Lead    ASSESSMENT AND PLAN:   77 y/o with PMH significant h/o parkinsons disease, dementia, A. fib who is wheelchair-bound at baseline presents to hospital secondary to worsening confusion/lethargy.  Noted to have pneumonia and UTI  1.  Sepsis-secondary to right-sided pneumonia and also urinary tract infection -Concern for aspiration due to right-sided pneumonia.  Appreciate speech consult. -Status post modified barium swallow study done.  Patient on a dysphagia diet with nectar thick at this time -Blood cultures and urine cultures are pending -Continue Levaquin -IV fluids, f/u CXR in AM - flutter valve  2.  Acute on chronic renal failure-known history of  CKD stage III with baseline creatinine of 1.3 -Continue IV fluids, avoid for nephrotoxins -Slowly improving creatinine.  3.  Parkinson's disease with dementia-seems to be close to baseline.  Mental status is improving.  Wheelchair-bound at baseline. -Restart his Sinemet, Aricept. PT  consult  4.  GERD-on PPI  5.  DVT prophylaxis-Lovenox  Wife updated at bedside  All the records are reviewed and case discussed with Care Management/Social Workerr. Management plans discussed with the patient, family and they are in agreement.  CODE STATUS: DNR  TOTAL TIME TAKING CARE OF THIS PATIENT: 29 minutes.   POSSIBLE D/C IN 1-2 DAYS, DEPENDING ON CLINICAL CONDITION.   Gladstone Lighter M.D on 08/22/2018 at 1:56 PM  Between 7am to 6pm - Pager - [phone]  After 6pm go to [email or web address] - password [email or web address] [facility]  Office  [phone]  CC: Primary care physician; Patient, No Pcp Per

## 2018-08-22 NOTE — Progress Notes (Signed)
Speech Language Pathology Treatment: Dysphagia  Patient Details Name: Ian Estrada MRN: [id number] DOB: 1940-12-30 Today's Date: 08/22/2018 Time: 1125-1200 SLP Time Calculation (min) (ACUTE ONLY): 35 min  Assessment / Plan / Recommendation Clinical Impression Pt seen for ongoing toleration of the newly recommended diet consistency post MBSS yesterday, 08/21/2018. Wife present in room. She stated pt has been sleeping most of the morning; appeared so currently in more of a fetal position down in the bed. Took this opportunity to have an education session w/ Wife to provide information as pt will be discharging home w/ [facility] services and her care.  Discussed results of his MBSS yesterday and the need for the recommended diet to reduce the work load/effort of mastication w/ solids. Also discussed the increased risk for aspiration of thin liquids d/t his delay in swallow initiation and now the need for the NECTAR consistency liquids. Further discussed dysphagia, risk for aspiration, and risk for impact and/or decline on the Pulmonary status. Discussed his recommended diet consistency and food preparation/options for foods in his diet w/ monitoring for a balance b/t pleasure and consistency. Suggested desired foods slightly modified by the blender or mashed w/ a fork; the use of gravies to moisten; Soups; naturally thickened liquids in the stores. Provided handouts on ordering information for the thickened liquids and suggestions on the diet. Provided prethickened liquids and a can of thickening powder for home use.  Recommend the Dysphagia level 1 (Puree) diet w/ NECTAR consistency liquids w/ aspiration precautions; Pills in Puree - Crushed as able for safer swallowing. ST services will continue to f/u w/ pt while admitted on his toleration of the recommended diet; education w/ Wife as needed. Recommended ongoing oral care.  NSG updated.     HPI HPI: Pt is a 77 y.o. male with a known history of working  since disease with Parkinson's dementia, atrial fibrillation, history of UTI, comes from home accompanied by wife who takes care of him along with caregivers in the house with fever decreased responsiveness/l lethargy. In the ER patient was found to have lactic acid of 2.4 elevated white count and found to have right upper and middle lobe pneumonia.  Patient has allergy to penicillin he received a dose of vancomycin and Levaquin patient has history of chronic kidney disease. His baseline creatinine is around 1.3. Creatinine today's 2.9. Clinically appears dehydrated  He is being admitted with sepsis due to pneumonia and UTI along with acute renal failure.  Chart review show no known swallowing assessment with SLP in the past so a MBSS was completed yesterday, 08/21/2018 which revealed moderate oropharyngeal phase Dysphagia w/ risk for aspiration thus risk for Pulmonary impact/decline.      SLP Plan  Continue with current plan of care       Recommendations  Diet recommendations: Dysphagia 1 (puree);Nectar-thick liquid Liquids provided via: Teaspoon;Cup;Straw(monitoring) Medication Administration: Crushed with puree(for safer swallowing and oral clearing) Supervision: Staff to assist with self feeding;Full supervision/cueing for compensatory strategies Compensations: Minimize environmental distractions;Slow rate;Small sips/bites;Lingual sweep for clearance of pocketing;Multiple dry swallows after each bite/sip;Follow solids with liquid Postural Changes and/or Swallow Maneuvers: Seated upright 90 degrees;Upright 30-60 min after meal                General recommendations: (Dietician f/u) Oral Care Recommendations: Oral care BID;Staff/trained caregiver to provide oral care Follow up Recommendations: Home health SLP SLP Visit Diagnosis: Dysphagia, oropharyngeal phase (R13.12) Plan: Continue with current plan of care       GO  Orinda Kenner, MS,  CCC-SLP , 08/22/2018, 12:42 PM

## 2018-08-23 ENCOUNTER — Inpatient Hospital Stay: Payer: Non-veteran care

## 2018-08-23 LAB — BASIC METABOLIC PANEL
Anion gap: 8 (ref 5–15)
BUN: 36 mg/dL — ABNORMAL HIGH (ref 8–23)
CO2: 25 mmol/L (ref 22–32)
Calcium: 8.6 mg/dL — ABNORMAL LOW (ref 8.9–10.3)
Chloride: 114 mmol/L — ABNORMAL HIGH (ref 98–111)
Creatinine, Ser: 1.6 mg/dL — ABNORMAL HIGH (ref 0.61–1.24)
GFR calc non Af Amer: 41 mL/min — ABNORMAL LOW (ref >60)
GFR, EST AFRICAN AMERICAN: 47 mL/min — AB (ref >60)
Glucose, Bld: 113 mg/dL — ABNORMAL HIGH (ref 70–99)
POTASSIUM: 3.3 mmol/L — AB (ref 3.5–5.1)
Sodium: 147 mmol/L — ABNORMAL HIGH (ref 135–145)

## 2018-08-23 LAB — CBC
HEMATOCRIT: 31.2 % — AB (ref 39.0–52.0)
HEMOGLOBIN: 10 g/dL — AB (ref 13.0–17.0)
MCH: 27.7 pg (ref 26.0–34.0)
MCHC: 32.1 g/dL (ref 30.0–36.0)
MCV: 86.4 fL (ref 80.0–100.0)
NRBC: 0 % (ref 0.0–0.2)
Platelets: 181 10*3/uL (ref 150–400)
RBC: 3.61 MIL/uL — AB (ref 4.22–5.81)
RDW: 13.1 % (ref 11.5–15.5)
WBC: 7.2 10*3/uL (ref 4.0–10.5)

## 2018-08-23 MED ORDER — POTASSIUM CHLORIDE CRYS ER 20 MEQ PO TBCR
40.0000 meq | EXTENDED_RELEASE_TABLET | Freq: Once | ORAL | Status: AC
  Administered 2018-08-23: 40 meq via ORAL
  Filled 2018-08-23: qty 2

## 2018-08-23 MED ORDER — LEVOFLOXACIN 500 MG PO TABS: 500.0000 mg | ORAL_TABLET | Freq: Every day | ORAL | 0 refills | Status: AC

## 2018-08-23 NOTE — Discharge Summary (Signed)
[facility] [facility] [place] NAME: Ian Estrada    MR#:  [id number]  DATE OF BIRTH:  July 25, 1941  DATE OF ADMISSION:  08/20/2018   ADMITTING PHYSICIAN: Fritzi Mandes, MD  DATE OF DISCHARGE: 08/23/18  PRIMARY CARE PHYSICIAN: Patient, No Pcp Per   ADMISSION DIAGNOSIS:   AKI (acute kidney injury) ([place]) [N17.9] Urinary tract infection without hematuria, site unspecified [N39.0] Pneumonia of right upper lobe due to infectious organism ([place]) [J18.1] Sepsis, due to unspecified organism, unspecified whether acute organ dysfunction present ([place]) [A41.9]  DISCHARGE DIAGNOSIS:   Active Problems:   Sepsis ([place])   SECONDARY DIAGNOSIS:   Past Medical History:  Diagnosis Date  . Arthritis   . Cancer ([place])    skin - removal  . Dysrhythmia    afib  . GERD (gastroesophageal reflux disease)   . Mild cognitive impairment with memory loss    short term memory d/t parkinson's disease.  . Parkinson disease ([place])   . Unsteady gait   . Urinary frequency     HOSPITAL COURSE:   77 y/o with PMH significant h/o parkinsons disease, dementia, A. fib who is wheelchair-bound at baseline presents to hospital secondary to worsening confusion/lethargy.  Noted to have pneumonia and UTI  1.  Sepsis-secondary to right-sided pneumonia and also urinary tract infection -Concern for aspiration due to right-sided pneumonia.  Appreciate speech consult. -Status post modified barium swallow study done.  Patient on a dysphagia diet with nectar thick at this time -Blood cultures and urine cultures are negative -Continue Levaquin -Repeat chest x-ray showing improvement in pneumonia.  2.  Acute on chronic renal failure-known history of CKD stage III with baseline creatinine of 1.3 -Received IV fluids,  much improved creatinine  3.  Parkinson's disease with dementia-seems to be close to baseline.  Mental status is improving.  Wheelchair-bound at baseline.  Self speech and  prefers to sleep mostly -on Sinemet, Aricept. PT consult  4.    Hypertension-had low blood pressures as outpatient and was on Florinef, however that was discontinued recently due to elevated blood pressures. -It was restarted in the hospital so the blood pressures were elevated. -Plan to stop at discharge.  If pressures remain still elevated within the next week, can start oral antihypertensives. -Patient will be followed at home by doctors making house calls and wife is aware of this.  Home health offered to family, wife refused and states patient has caregivers at night and she and her her grandkids help the patient during the daytime.  Wife updated at bedside  DISCHARGE CONDITIONS:   Guarded  CONSULTS OBTAINED:   None  DRUG ALLERGIES:   Allergies  Allergen Reactions  . Penicillins Other (See Comments)    Unknown reaction per wife-Childhood reaction   DISCHARGE MEDICATIONS:   Allergies as of 08/23/2018      Reactions   Penicillins Other (See Comments)   Unknown reaction per wife-Childhood reaction      Medication List    STOP taking these medications   FLUDROCORTISONE ACETATE PO     TAKE these medications   ARICEPT 10 MG tablet Generic drug:  donepezil Take 10 mg by mouth daily with supper.   aspirin EC 81 MG tablet Take 81 mg by mouth every morning.   B COMPLEX PO Take 1 tablet by mouth daily.   carbidopa-levodopa 25-100 MG tablet Commonly known as:  SINEMET IR Take 1 tablet by mouth 4 (four) times daily. Takes 1.5 tab twice daily &  1 tab with dinner   levofloxacin 500 MG tablet Commonly known as:  LEVAQUIN Take 1 tablet (500 mg total) by mouth daily for 7 days.   Melatonin 3 MG Tabs Take 3 mg by mouth at bedtime.   omeprazole 20 MG capsule Commonly known as:  PRILOSEC Take 20 mg by mouth daily.   rOPINIRole 0.5 MG tablet Commonly known as:  REQUIP Take 0.5 mg by mouth daily with supper.   VITAMIN D PO Take 1,000 Units by mouth daily.         DISCHARGE INSTRUCTIONS:   1. PCP f/u in 1 week 2. Repeat MBSS in 4 weeks  DIET:   Dysphagia 1 (Puree) diet with nectar thick liquids with aspiration precautions  ACTIVITY:   Activity as tolerated  OXYGEN:   Home Oxygen: No.  Oxygen Delivery: room air  DISCHARGE LOCATION:   home   If you experience worsening of your admission symptoms, develop shortness of breath, life threatening emergency, suicidal or homicidal thoughts you must seek medical attention immediately by calling 911 or calling your MD immediately  if symptoms less severe.  You Must read complete instructions/literature along with all the possible adverse reactions/side effects for all the Medicines you take and that have been prescribed to you. Take any new Medicines after you have completely understood and accpet all the possible adverse reactions/side effects.   Please note  You were cared for by a hospitalist during your hospital stay. If you have any questions about your discharge medications or the care you received while you were in the hospital after you are discharged, you can call the unit and asked to speak with the hospitalist on call if the hospitalist that took care of you is not available. Once you are discharged, your primary care physician will handle any further medical issues. Please note that NO REFILLS for any discharge medications will be authorized once you are discharged, as it is imperative that you return to your primary care physician (or establish a relationship with a primary care physician if you do not have one) for your aftercare needs so that they can reassess your need for medications and monitor your lab values.    On the day of Discharge:  VITAL SIGNS:   Blood pressure (!) 160/91, pulse 71, temperature 98.4 F (36.9 C), temperature source Oral, resp. rate 18, height 6\' 2"  (1.88 m), weight 69.5 kg, SpO2 98 %.  PHYSICAL EXAMINATION:    GENERAL:  77 y.o.-year-old patient  lying in the bed with no acute distress.  EYES: Pupils equal, round, reactive to light and accommodation. No scleral icterus. Extraocular muscles intact.  HEENT: Head atraumatic, normocephalic. Oropharynx and nasopharynx clear.  NECK:  Supple, no jugular venous distention. No thyroid enlargement, no tenderness.  LUNGS: Normal breath sounds bilaterally, decreased right sided breath sounds. no wheezing, rales or crepitation. No use of accessory muscles of respiration.  CARDIOVASCULAR: S1, S2 normal. No rubs, or gallops. 2/6 systolic murmur ABDOMEN: Soft, nontender, nondistended. Bowel sounds present. No organomegaly or mass.  EXTREMITIES: No pedal edema, cyanosis, or clubbing.  NEUROLOGIC: Cranial nerves II through XII are intact.soft speech. Muscle strength 2/5 both lower legs chronic, upper arms 4/5. Sensation intact. Gait not checked.  PSYCHIATRIC: The patient is usually sleepy, but nods head, soft speech.  SKIN: No obvious rash, lesion, or ulcer.  DATA REVIEW:   CBC Recent Labs  Lab 08/23/18 0305  WBC 7.2  HGB 10.0*  HCT 31.2*  PLT 181  Chemistries  Recent Labs  Lab 08/20/18 1025  08/23/18 0305  NA 143   < > 147*  K 4.0   < > 3.3*  CL 107   < > 114*  CO2 26   < > 25  GLUCOSE 132*   < > 113*  BUN 53*   < > 36*  CREATININE 2.90*   < > 1.60*  CALCIUM 9.2   < > 8.6*  AST 22  --   --   ALT 7  --   --   ALKPHOS 61  --   --   BILITOT 0.8  --   --    < > = values in this interval not displayed.     Microbiology Results  Results for orders placed or performed during the hospital encounter of 08/20/18  Urine culture     Status: None   Collection Time: 08/20/18 10:25 AM  Result Value Ref Range Status   Specimen Description   Final    URINE, RANDOM Performed at [facility], [address] [place], [place] [address]    Special Requests   Final    NONE Performed at [facility], [address] [place], [place] [address]    Culture   Final    NO  GROWTH Performed at [facility], [place] [address], [place], [place] [address]    Report Status 08/21/2018 FINAL  Final  Culture, blood (routine x 2)     Status: None (Preliminary result)   Collection Time: 08/20/18 10:34 AM  Result Value Ref Range Status   Specimen Description BLOOD BLOOD LEFT FOREARM  Final   Special Requests   Final    BOTTLES DRAWN AEROBIC AND ANAEROBIC Blood Culture results may not be optimal due to an excessive volume of blood received in culture bottles   Culture   Final    NO GROWTH 3 DAYS Performed at [facility], [address], [place], [place] [address]    Report Status PENDING  Incomplete  Culture, blood (routine x 2)     Status: None (Preliminary result)   Collection Time: 08/20/18 10:35 AM  Result Value Ref Range Status   Specimen Description BLOOD BLOOD RIGHT HAND  Final   Special Requests   Final    BOTTLES DRAWN AEROBIC AND ANAEROBIC Blood Culture adequate volume   Culture   Final    NO GROWTH 3 DAYS Performed at [facility], [address] [place],  [address]    Report Status PENDING  Incomplete    RADIOLOGY:  Dg Chest 2 View  Result Date: 08/23/2018 CLINICAL DATA:  Evaluate for pneumonia. EXAM: CHEST - 2 VIEW COMPARISON:  08/20/2018 FINDINGS: Patient slightly rotated to the left. Lungs are adequately inflated with interval improvement in the previously seen right mid to lower lung airspace process with minimal residual airspace disease. No effusion. Cardiomediastinal silhouette and remainder of the exam is unchanged. IMPRESSION: Improving pneumonia right mid to lower lung. Electronically Signed   By: Marin Olp M.D.   On: 08/23/2018 09:54     Management plans discussed with the patient, family and they are in agreement.  CODE STATUS:     Code Status Orders  (From admission, onward)         Start     Ordered   08/20/18 1826  Do not attempt resuscitation (DNR)  Continuous    Question Answer Comment   In the event of cardiac or respiratory ARREST  Do not call a "code blue"   In the event of cardiac or respiratory ARREST Do not perform Intubation, CPR, defibrillation or ACLS   In the event of cardiac or respiratory ARREST Use medication by any route, position, wound care, and other measures to relive pain and suffering. May use oxygen, suction and manual treatment of airway obstruction as needed for comfort.      08/20/18 1826        Code Status History    This patient has a current code status but no historical code status.    Advance Directive Documentation     Most Recent Value  Type of Advance Directive  Out of facility DNR (pink MOST or yellow form)  Pre-existing out of facility DNR order (yellow form or pink MOST form)  Yellow form placed in chart (order not valid for inpatient use)  "MOST" Form in Place?  -      TOTAL TIME TAKING CARE OF THIS PATIENT: 38 minutes.    Nairi Oswald M.D on 08/23/2018 at 12:18 PM  Between 7am to 6pm - Pager - [phone]  After 6pm go to [email or web address] - Proofreader  [facility]  Office  [phone]  CC: Primary care physician; Patient, No Pcp Per   Note: This dictation was prepared with Dragon dictation along with smaller phrase technology. Any transcriptional errors that result from this process are unintentional.

## 2018-08-23 NOTE — Plan of Care (Signed)
Provided routine nursing care. At around 2030, noted patient to have an elevated blood pressure. Obtained and administer PRN order of Metropolol 5mg  IV at 2100. BP rechecked at 2115, effectiveness noted, BP lowered. Patient kept safe and comfortable. Repositioned at frequent intervals. Condom catheter placed for incontinence. Will continue to monitor.

## 2018-08-23 NOTE — Discharge Instructions (Signed)
Recommend the Dysphagia level 1 (Puree) diet w/ NECTAR consistency liquids w/ aspiration precautions; Pills in Puree - Crushed as able for safer swallowing

## 2018-08-23 NOTE — Progress Notes (Signed)
Pt D/C to home with wife and son. IVs removed intact. VSS. Education completed. All questions answered.

## 2018-08-25 LAB — CULTURE, BLOOD (ROUTINE X 2)
CULTURE: NO GROWTH
Culture: NO GROWTH
SPECIAL REQUESTS: ADEQUATE

## 2020-06-20 IMAGING — RF DG SWALLOWING FUNCTION - NRPT [facility]
11 series · 13 of 24 positions shown · non-contrast
Comparison: none

[Series 1: run · 1 of 36 frames shown (1 of 11)]
[frame 6/36]
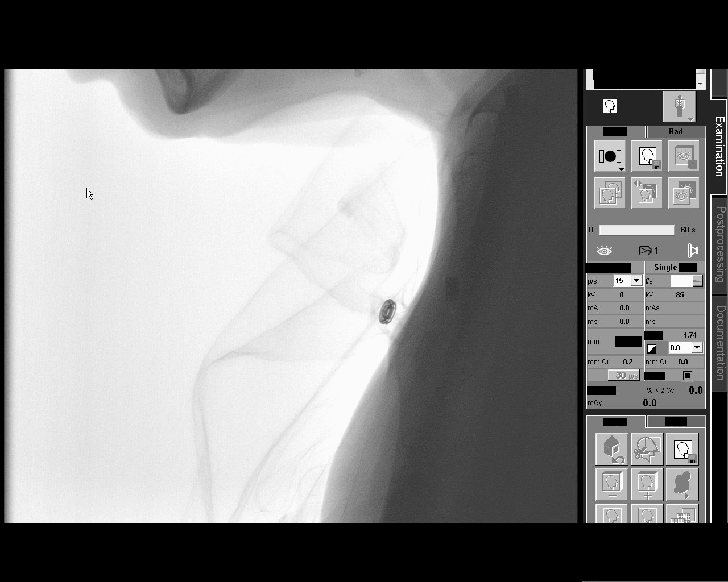

[Series 2: run · 1 of 44 frames shown (2 of 11)]
[frame 17/44]
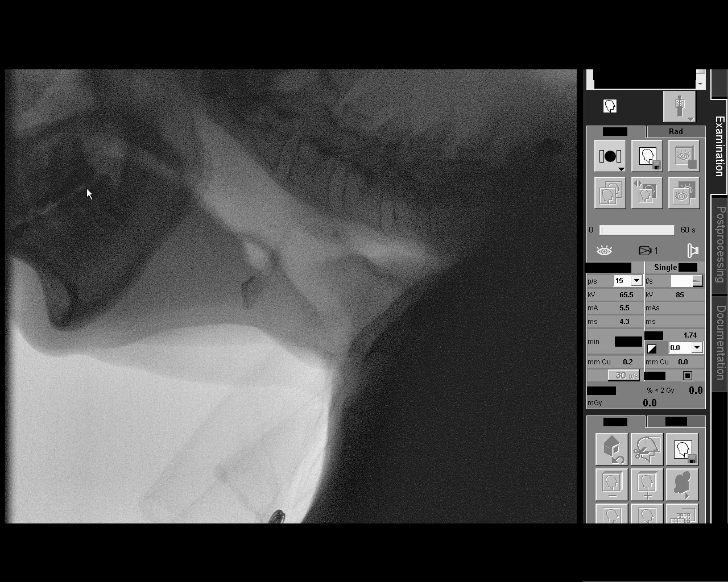

[Series 3: run · 1 of 149 frames shown (3 of 11)]
[frame 23/149]
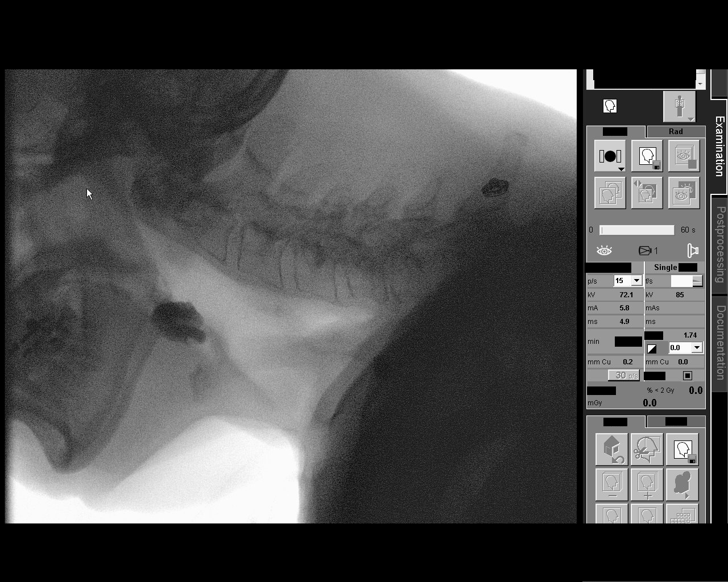

[Series 4: run · 1 of 888 frames shown (4 of 11)]
[frame 73/888]
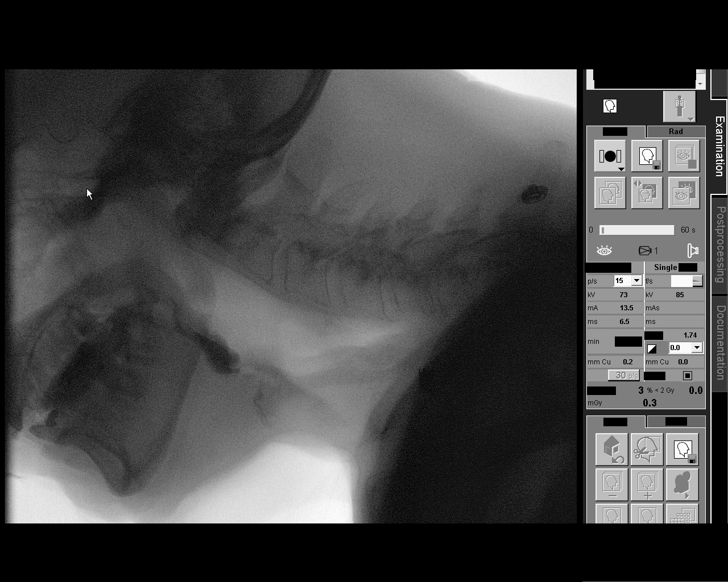

[Series 5: run · 2 of 7 frames shown (5 of 11)]
[frame 2/7]
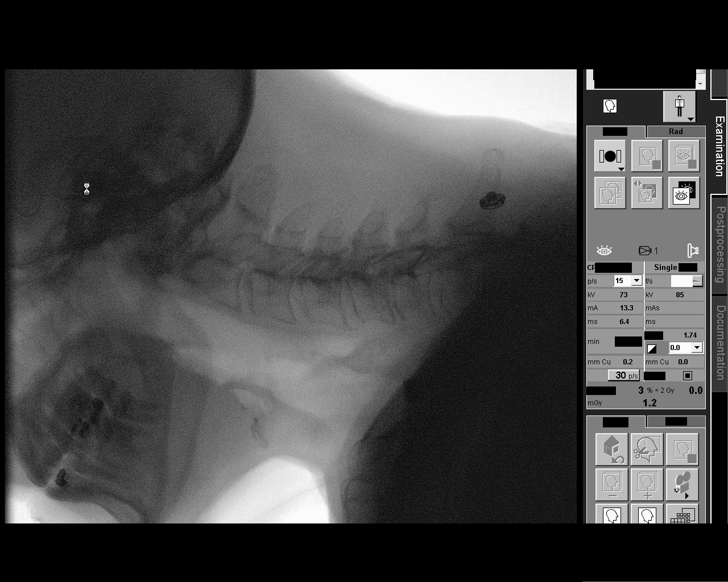
[frame 6/7]
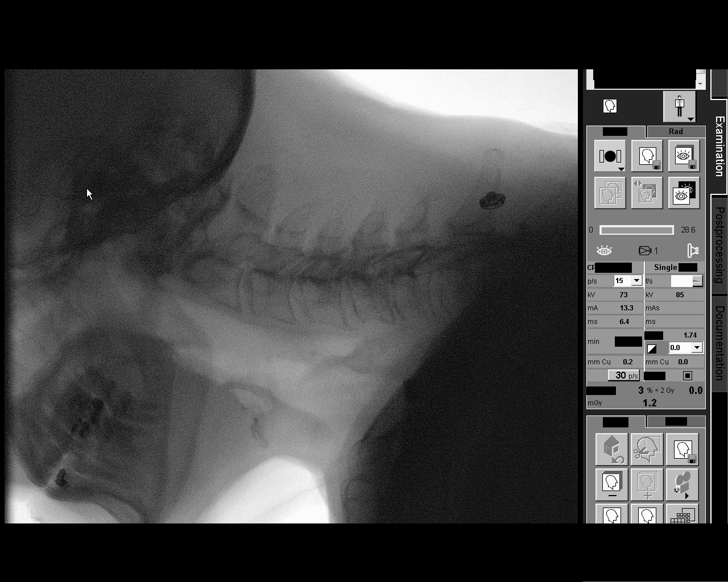

[Series 6: run · 1 of 155 frames shown (6 of 11)]
[frame 132/155]
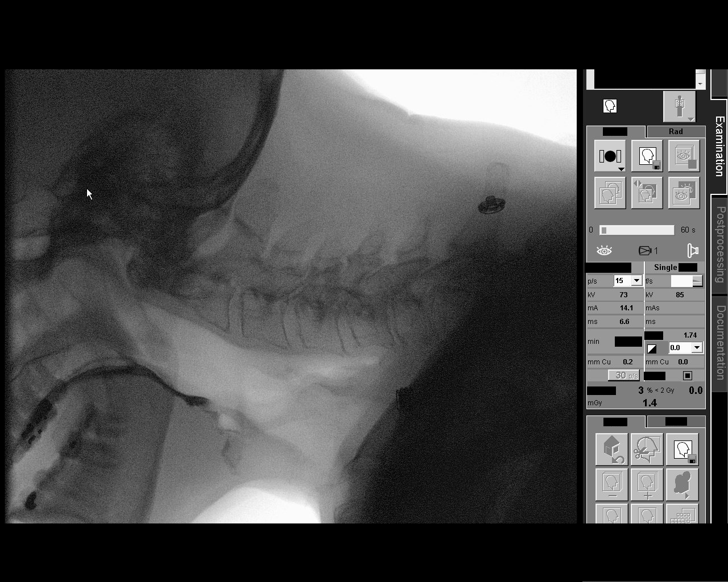

[Series 7: run · 1 of 55 frames shown (7 of 11)]
[frame 28/55]
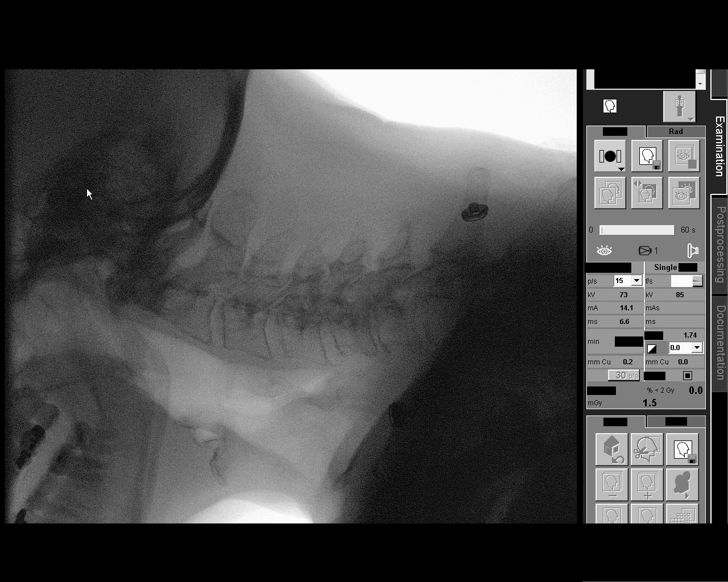

[Series 8: run · 1 of 272 frames shown (8 of 11)]
[frame 33/272]
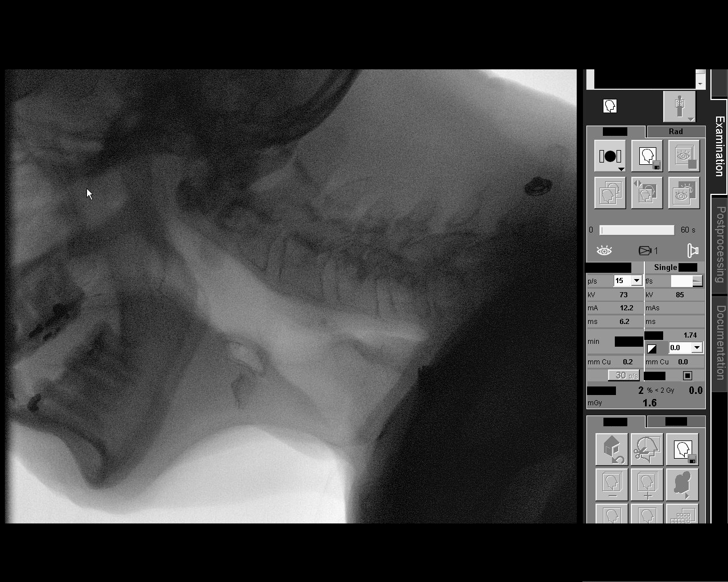

[Series 9: run · 1 of 578 frames shown (9 of 11)]
[frame 51/578]
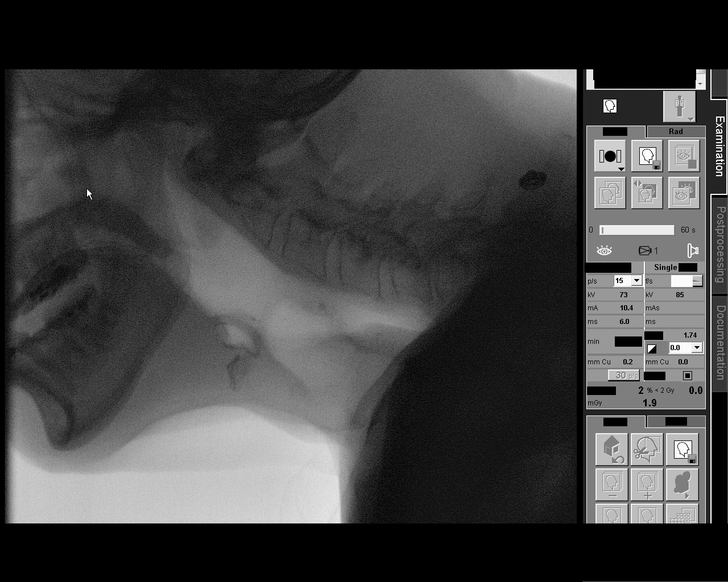

[Series 10: run · 2 of 543 frames shown (10 of 11)]
[frame 82/543]
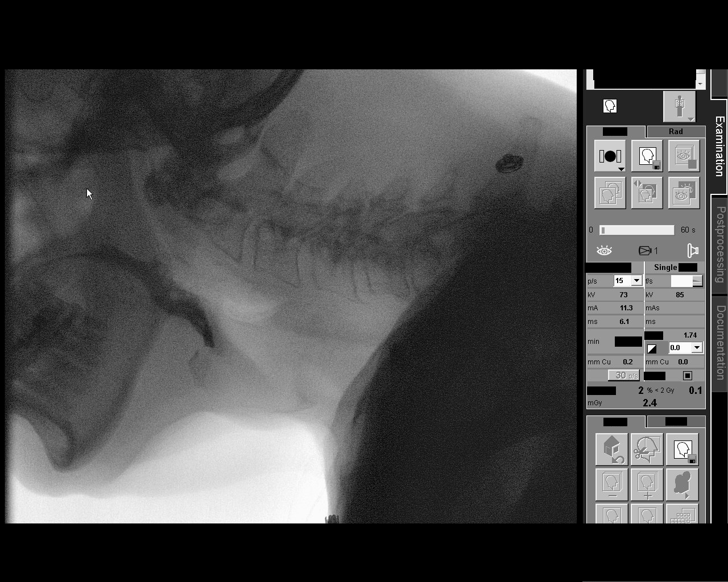
[frame 472/543]
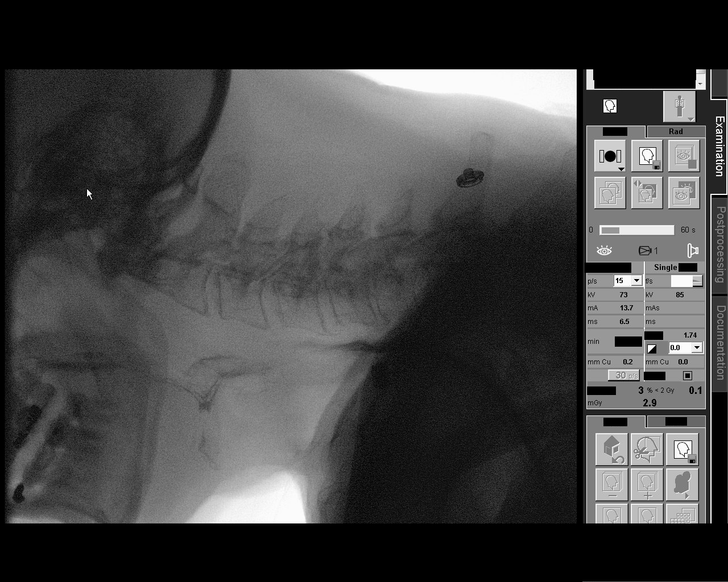

[Series 11: run · 1 of 272 frames shown (11 of 11)]
[frame 232/272]
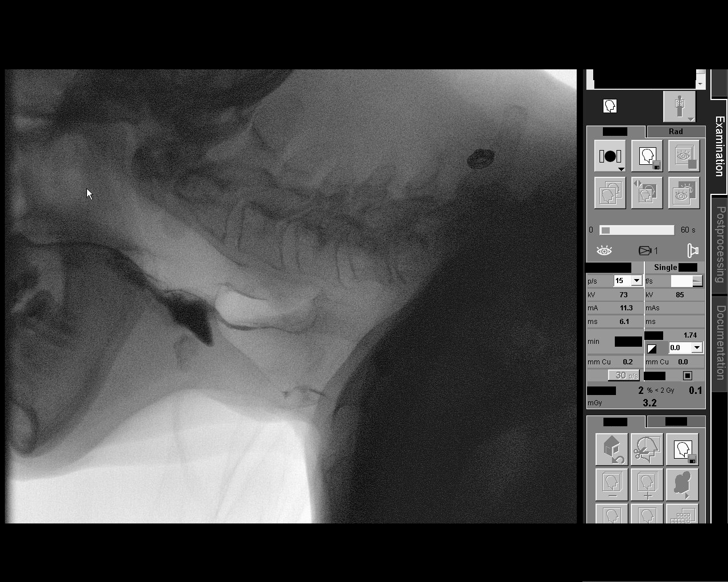

[13 of 24 positions shown; findings below may reference images not displayed]

FLUOROSCOPY FOR SWALLOWING FUNCTION STUDY:
Fluoroscopy was provided for swallowing function study, which was administered by a speech pathologist.  Final results and recommendations from this study are contained within the speech pathology report.

## 2020-06-22 IMAGING — CR DG CHEST 2V
1 series · 2 of 2 positions shown · non-contrast
Comparison: 08/20/2018

CLINICAL DATA: Evaluate for pneumonia.

EXAM:
CHEST - 2 VIEW

[Series 1: dg chest 2 view · 0.14mm/px · 2 of 2 slices shown]
[im 1/2]
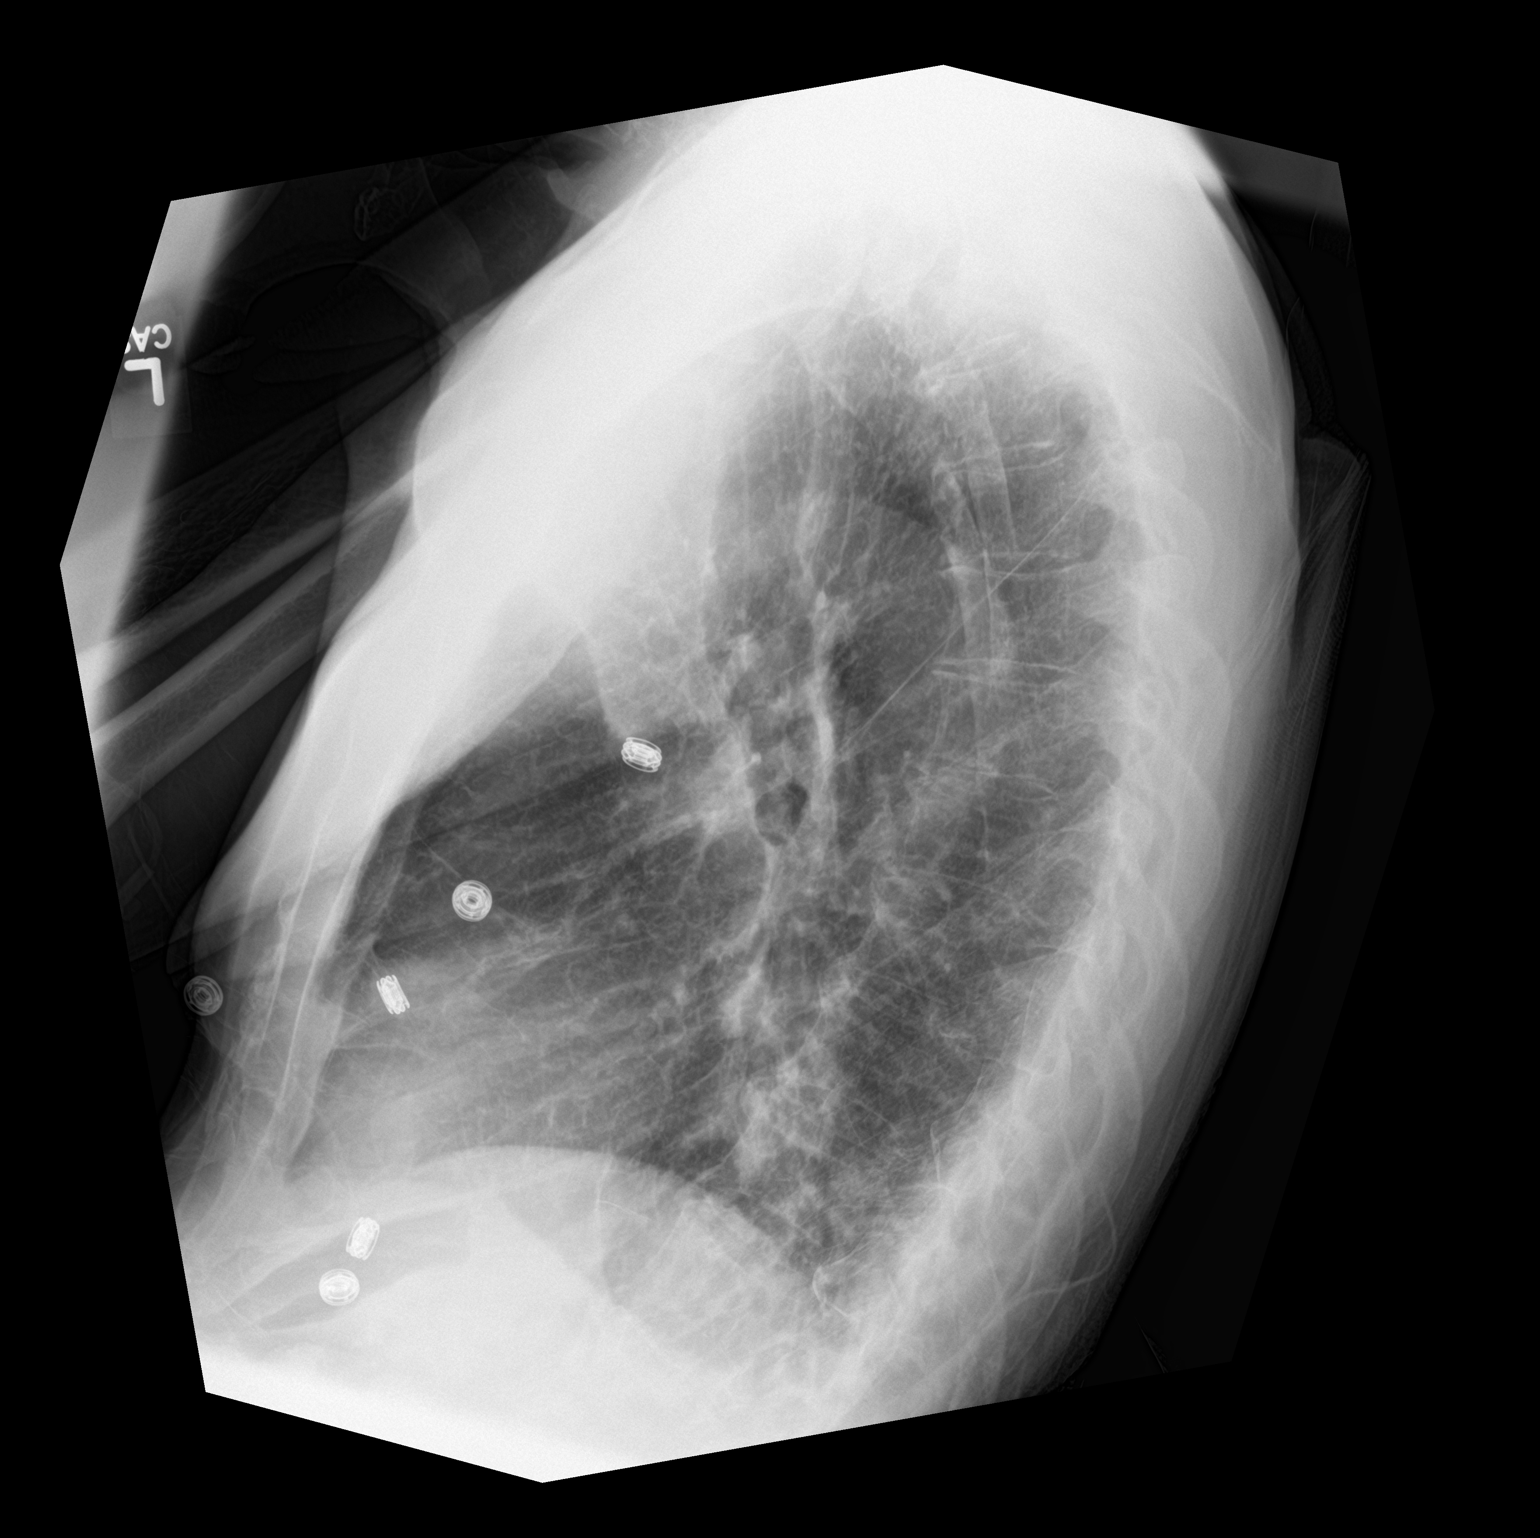
[im 2/2]
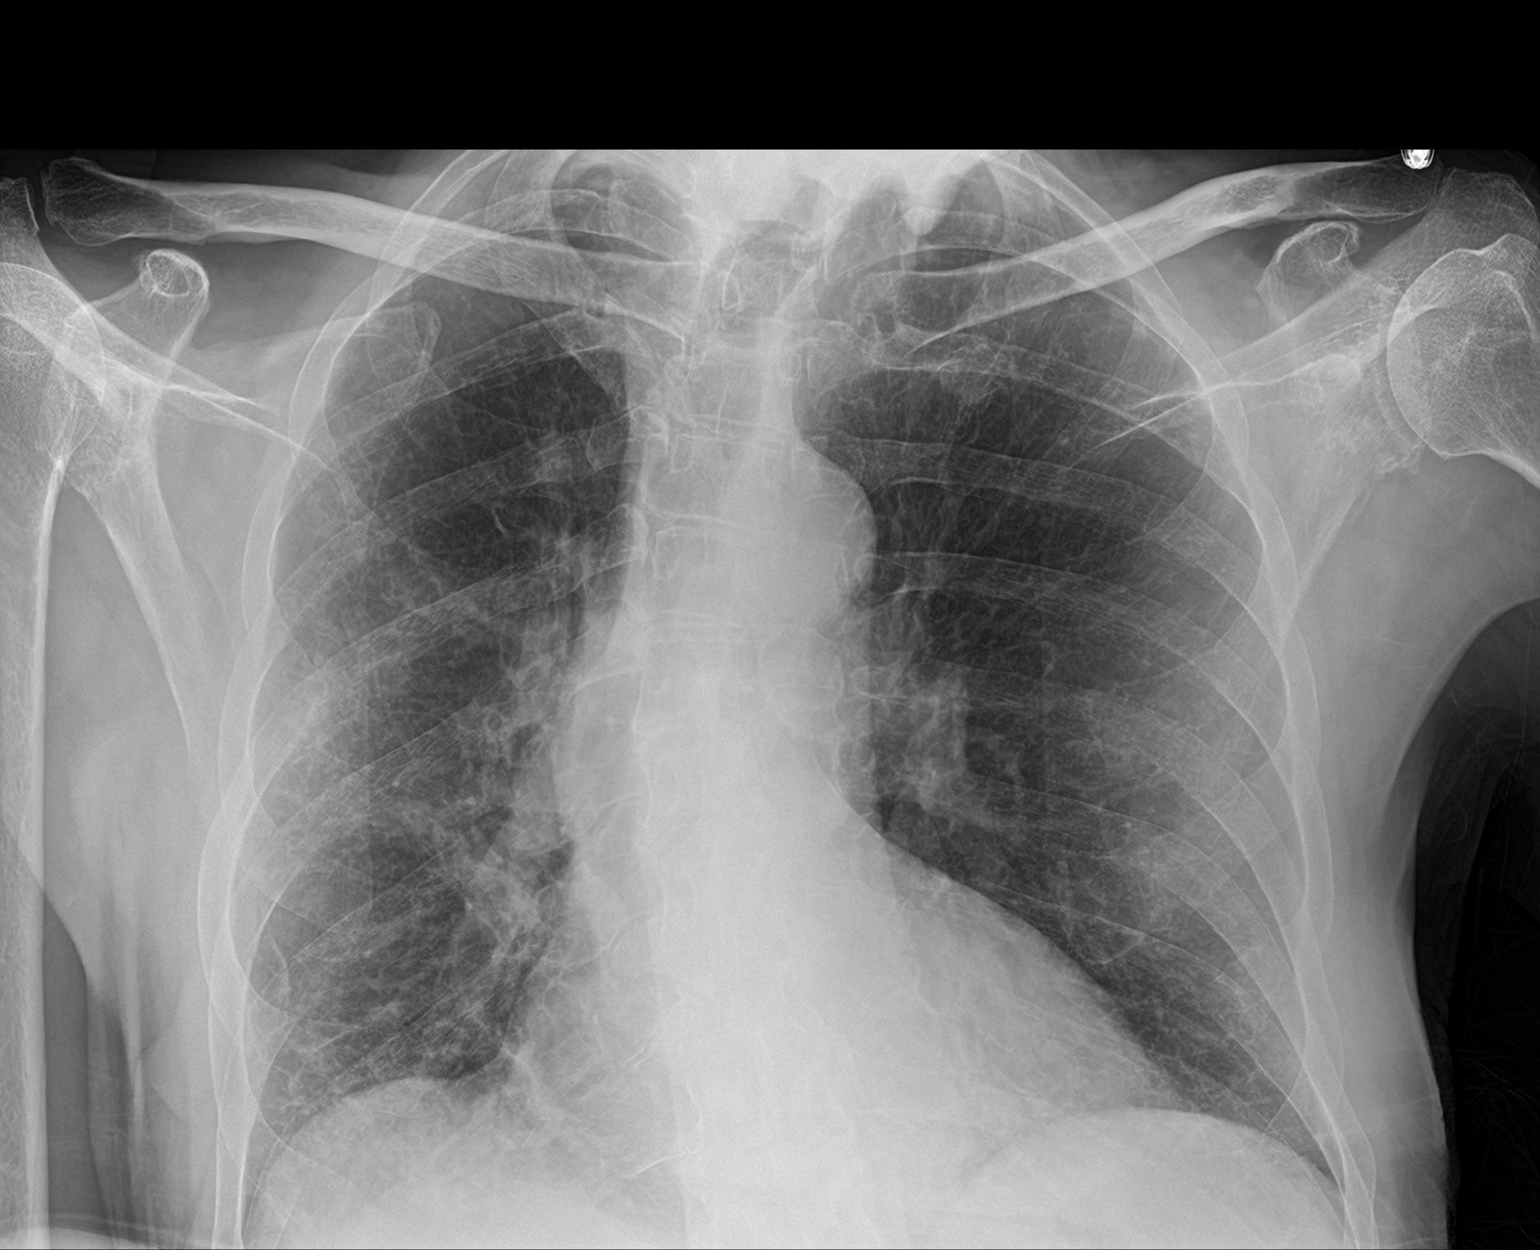

[2 of 2 positions shown; findings below may reference images not displayed]

FINDINGS: Patient slightly rotated to the left. Lungs are adequately inflated
with interval improvement in the previously seen right mid to lower
lung airspace process with minimal residual airspace disease. No
effusion. Cardiomediastinal silhouette and remainder of the exam is
unchanged.
IMPRESSION: Improving pneumonia right mid to lower lung.

## 2022-03-04 DEATH — deceased
